# Patient Record
Sex: Female | Born: 1935 | Race: White | Hispanic: No | Smoking: Former smoker
Health system: Southern US, Community
[De-identification: ages and names within clinical notes are randomized; demographics above are authoritative.]

## PROBLEM LIST (undated history)

## (undated) DIAGNOSIS — R000 Tachycardia, unspecified: Secondary | ICD-10-CM

## (undated) DIAGNOSIS — R112 Nausea with vomiting, unspecified: Secondary | ICD-10-CM

## (undated) DIAGNOSIS — M1990 Unspecified osteoarthritis, unspecified site: Secondary | ICD-10-CM

## (undated) DIAGNOSIS — C801 Malignant (primary) neoplasm, unspecified: Secondary | ICD-10-CM

## (undated) DIAGNOSIS — I491 Atrial premature depolarization: Secondary | ICD-10-CM

## (undated) DIAGNOSIS — I809 Phlebitis and thrombophlebitis of unspecified site: Secondary | ICD-10-CM

## (undated) DIAGNOSIS — E785 Hyperlipidemia, unspecified: Secondary | ICD-10-CM

## (undated) DIAGNOSIS — S61209A Unspecified open wound of unspecified finger without damage to nail, initial encounter: Secondary | ICD-10-CM

## (undated) DIAGNOSIS — I10 Essential (primary) hypertension: Secondary | ICD-10-CM

## (undated) HISTORY — DX: Unspecified osteoarthritis, unspecified site: M19.90

## (undated) HISTORY — PX: MULTIPLE TOOTH EXTRACTIONS: SHX2053

## (undated) HISTORY — DX: Nausea with vomiting, unspecified: R11.2

## (undated) HISTORY — DX: Hyperlipidemia, unspecified: E78.5

## (undated) HISTORY — DX: Phlebitis and thrombophlebitis of unspecified site: I80.9

## (undated) HISTORY — DX: Tachycardia, unspecified: R00.0

## (undated) HISTORY — DX: Essential (primary) hypertension: I10

## (undated) HISTORY — DX: Atrial premature depolarization: I49.1

## (undated) HISTORY — PX: KIDNEY SURGERY: SHX687

## (undated) HISTORY — DX: Unspecified open wound of unspecified finger without damage to nail, initial encounter: S61.209A

---
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
## 1971-02-07 HISTORY — PX: ABDOMINAL HYSTERECTOMY: SHX81

## 1983-02-07 HISTORY — PX: STOMACH SURGERY: SHX791

## 1997-02-06 HISTORY — PX: COLON RESECTION: SHX5231

## 2009-02-06 HISTORY — PX: LUNG SURGERY: SHX703

## 2010-03-02 ENCOUNTER — Ambulatory Visit
Admission: RE | Admit: 2010-03-02 | Discharge: 2010-03-02 | Payer: Self-pay | Source: Home / Self Care | Admitting: Emergency Medicine

## 2010-03-02 ENCOUNTER — Emergency Department (HOSPITAL_BASED_OUTPATIENT_CLINIC_OR_DEPARTMENT_OTHER)
Admission: EM | Admit: 2010-03-02 | Discharge: 2010-03-02 | Payer: Self-pay | Source: Home / Self Care | Admitting: Emergency Medicine

## 2010-03-02 DIAGNOSIS — S61209A Unspecified open wound of unspecified finger without damage to nail, initial encounter: Secondary | ICD-10-CM | POA: Insufficient documentation

## 2010-03-02 DIAGNOSIS — E785 Hyperlipidemia, unspecified: Secondary | ICD-10-CM | POA: Insufficient documentation

## 2010-03-03 ENCOUNTER — Encounter: Payer: Self-pay | Admitting: Family Medicine

## 2010-03-03 ENCOUNTER — Telehealth (INDEPENDENT_AMBULATORY_CARE_PROVIDER_SITE_OTHER): Payer: Self-pay | Admitting: *Deleted

## 2010-03-10 NOTE — Assessment & Plan Note (Signed)
Summary: RT INDEX FINGER LAC./WSE   Vital Signs:  Patient Profile:   77 Years Old Female CC:      right index finger laceration O2 Sat:      98 % O2 treatment:    Room Air Pulse rate:   70 / minute Resp:     14 per minute BP sitting:   137 / 80  (left arm) Cuff size:   regular  Vitals Entered By: Lajean Saver RN (March 02, 2010 7:13 PM)                  Updated Prior Medication List: WARFARIN SODIUM 2.5 MG TABS (WARFARIN SODIUM) once daily * VITAMIN D  LOVASTATIN 10 MG TABS (LOVASTATIN)   Current Allergies: No known allergies History of Present Illness History from: patient & her daughter Chief Complaint: right index finger laceration History of Present Illness: She cut the tip of her R finger while opening a can today.  Doesn't recall her last Td.  She is taking Warfarin and she is still bleeding after 3 hours of putting pressure on it.  She called her PCP and told her to go to the ER.  Her last INR was  ~3.5.  Tenderness, no redness, no pus.  No F/C/N/V, dizziness, weakness.  REVIEW OF SYSTEMS Constitutional Symptoms      Denies fever, chills, night sweats, weight loss, weight gain, and fatigue.  Eyes       Denies change in vision, eye pain, eye discharge, glasses, contact lenses, and eye surgery. Ear/Nose/Throat/Mouth       Denies hearing loss/aids, change in hearing, ear pain, ear discharge, dizziness, frequent runny nose, frequent nose bleeds, sinus problems, sore throat, hoarseness, and tooth pain or bleeding.  Respiratory       Denies dry cough, productive cough, wheezing, shortness of breath, asthma, bronchitis, and emphysema/COPD.  Cardiovascular       Denies murmurs, chest pain, and tires easily with exhertion.    Gastrointestinal       Denies stomach pain, nausea/vomiting, diarrhea, constipation, blood in bowel movements, and indigestion. Genitourniary       Denies painful urination, kidney stones, and loss of urinary control. Neurological       Denies  paralysis, seizures, and fainting/blackouts. Musculoskeletal       Denies muscle pain, joint pain, joint stiffness, decreased range of motion, redness, swelling, muscle weakness, and gout.  Skin       Denies bruising, unusual mles/lumps or sores, and hair/skin or nail changes.      Comments: laceration to right index finger Psych       Denies mood changes, temper/anger issues, anxiety/stress, speech problems, depression, and sleep problems. Other Comments: patient cut finger while opening a can @ 2:45 today. She could not stop the bleeding. TDaP needed and given   Past History:  Past Medical History: blood clot stomach CA Colon CA Hyperlipidemia  Past Surgical History: Stomach CA 1985 Colon CA 2000  Social History: Never Smoked Alcohol use-no Drug use-no Smoking Status:  never Drug Use:  no Physical Exam General appearance: well developed, well nourished, no acute distress Head: normocephalic, atraumatic Heart: regular rate and  rhythm, no murmur MSE: oriented to time, place, and person R index finger: less than 1cm horizontal laceration at the tip, superficial.  It is oozing blood.  Mild tenderness.  No sign of infection.  Pressure stops bleeding only temporarily.  Finger with FROM DIP, PIP, and MCP.  Distal NV status intact.  No  other lacerations. Assessment New Problems: OPEN WOUND FINGER WITHOUT MENTION COMPLICATION (ICD-883.0) HYPERLIPIDEMIA (ICD-272.4)   Plan New Orders: Tdap => 25yrs IM [90715] Admin 1st Vaccine [90471] New Patient Level III [99203] Repair Superficial Wound(s) 2.5cm or <(scalp,neck,axillae,ext gent,trunk,extre) [12001] Planning Comments:   The wound stopped bleeding after dermabond and gelfoam application.  I didn't suture it because the stitches would have caused further bleeding.  I have advised her to follow up with her PCP as already scheduled.  She also needs to speak to him about her Warfarin dose.  I would continue the med as directed by  him and not stop it.    If the wound continues to bleed despite our treatment here, I believe the next step would be the ER since I don't have any further treatment options.  The wound should be kept clean and dry and should close up in a few days.    The patient and/or caregiver has been counseled thoroughly with regard to medications prescribed including dosage, schedule, interactions, rationale for use, and possible side effects and they verbalize understanding.  Diagnoses and expected course of recovery discussed and will return if not improved as expected or if the condition worsens. Patient and/or caregiver verbalized understanding.   PROCEDURE:  Dermabond Site: R index finger Size: 3/4 cm Number of Lacerations: 1 Anesthesia: none Procedure: Cleaned finger in Hibaclens and saline.  Explored wound, no foreign bodies seen, very superficial.  Dermabond placed over wound, then used Gelfoam.  This stopped the bleeding.  Then was covered with gauze and wrapped with Coban.  Wound precautions explained to patient and daughter.  Orders Added: 1)  Tdap => 58yrs IM [90715] 2)  Admin 1st Vaccine [90471] 3)  New Patient Level III [33295] 4)  Repair Superficial Wound(s) 2.5cm or <(scalp,neck,axillae,ext gent,trunk,extre) [12001]   Immunizations Administered:  Tetanus Vaccine:    Vaccine Type: Tdap    Site: right deltoid    Mfr: GlaxoSmithKline    Dose: 0.5 ml    Route: IM    Given by: Lajean Saver RN    Exp. Date: 11/26/2011    Lot #: [id number]    VIS given: 12/25/07 version given March 02, 2010.   Immunizations Administered:  Tetanus Vaccine:    Vaccine Type: Tdap    Site: right deltoid    Mfr: GlaxoSmithKline    Dose: 0.5 ml    Route: IM    Given by: Lajean Saver RN    Exp. Date: 11/26/2011    Lot #: [id number]    VIS given: 12/25/07 version given March 02, 2010.

## 2010-03-10 NOTE — Miscellaneous (Signed)
Summary: TDaP VACCINE AUTH. FORM  TDaP VACCINE AUTH. FORM   Imported By: Tawni Carnes 03/03/2010 10:23:42  _____________________________________________________________________  External Attachment:    Type:   Image     Comment:   External Document

## 2010-03-10 NOTE — Progress Notes (Signed)
  Phone Note Outgoing Call   Call placed by: Clemens Catholic LPN,  March 03, 2010 8:52 AM Call placed to: Patient Summary of Call: call back: pt states that she has not had anymore bleeding from her finger laceration. reminded her if any problems to F/U with PCP or ED. pt agrees. Initial call taken by: Clemens Catholic LPN,  March 03, 2010 8:54 AM

## 2010-06-10 ENCOUNTER — Emergency Department (HOSPITAL_BASED_OUTPATIENT_CLINIC_OR_DEPARTMENT_OTHER)
Admission: EM | Admit: 2010-06-10 | Discharge: 2010-06-10 | Disposition: A | Discharge status: Home / Self Care | Payer: MEDICARE | Attending: Emergency Medicine | Admitting: Emergency Medicine

## 2010-06-10 ENCOUNTER — Emergency Department (INDEPENDENT_AMBULATORY_CARE_PROVIDER_SITE_OTHER): Payer: MEDICARE

## 2010-06-10 DIAGNOSIS — Y93E1 Activity, personal bathing and showering: Secondary | ICD-10-CM

## 2010-06-10 DIAGNOSIS — S2239XA Fracture of one rib, unspecified side, initial encounter for closed fracture: Secondary | ICD-10-CM

## 2010-06-10 DIAGNOSIS — W010XXA Fall on same level from slipping, tripping and stumbling without subsequent striking against object, initial encounter: Secondary | ICD-10-CM

## 2010-06-10 DIAGNOSIS — Z86718 Personal history of other venous thrombosis and embolism: Secondary | ICD-10-CM | POA: Insufficient documentation

## 2010-06-10 DIAGNOSIS — W1809XA Striking against other object with subsequent fall, initial encounter: Secondary | ICD-10-CM | POA: Insufficient documentation

## 2010-06-10 DIAGNOSIS — Y92009 Unspecified place in unspecified non-institutional (private) residence as the place of occurrence of the external cause: Secondary | ICD-10-CM | POA: Insufficient documentation

## 2010-06-10 LAB — BASIC METABOLIC PANEL
Calcium: 10.4 mg/dL (ref 8.4–10.5)
Creatinine, Ser: 1 mg/dL (ref 0.4–1.2)
GFR calc Af Amer: >60 mL/min (ref >60)
GFR calc non Af Amer: 53 mL/min — ABNORMAL LOW (ref >60)

## 2010-06-10 LAB — URINALYSIS, ROUTINE W REFLEX MICROSCOPIC
Bilirubin Urine: NEGATIVE
Nitrite: NEGATIVE
Specific Gravity, Urine: 1.017 (ref 1.005–1.030)
pH: 5 (ref 5.0–8.0)

## 2010-06-10 LAB — CBC
Hemoglobin: 14.2 g/dL (ref 12.0–15.0)
MCV: 86 fL (ref 78.0–100.0)
Platelets: 160 10*3/uL (ref 150–400)
RBC: 4.79 MIL/uL (ref 3.87–5.11)
WBC: 8.5 10*3/uL (ref 4.0–10.5)

## 2010-06-10 LAB — DIFFERENTIAL
Eosinophils Absolute: 0.1 10*3/uL (ref 0.0–0.7)
Lymphs Abs: 1.5 10*3/uL (ref 0.7–4.0)
Neutro Abs: 6.1 10*3/uL (ref 1.7–7.7)
Neutrophils Relative %: 72 % (ref 43–77)

## 2010-06-10 LAB — PROTIME-INR: INR: 1.84 — ABNORMAL HIGH (ref 0.00–1.49)

## 2010-06-21 ENCOUNTER — Emergency Department (INDEPENDENT_AMBULATORY_CARE_PROVIDER_SITE_OTHER): Payer: Medicare Other

## 2010-06-21 ENCOUNTER — Emergency Department (HOSPITAL_BASED_OUTPATIENT_CLINIC_OR_DEPARTMENT_OTHER)
Admission: EM | Admit: 2010-06-21 | Discharge: 2010-06-22 | Disposition: A | Discharge status: Other Acute Inpatient Hospital | Payer: Medicare Other | Source: Home / Self Care | Attending: Emergency Medicine | Admitting: Emergency Medicine

## 2010-06-21 DIAGNOSIS — Y92009 Unspecified place in unspecified non-institutional (private) residence as the place of occurrence of the external cause: Secondary | ICD-10-CM | POA: Insufficient documentation

## 2010-06-21 DIAGNOSIS — W19XXXA Unspecified fall, initial encounter: Secondary | ICD-10-CM | POA: Insufficient documentation

## 2010-06-21 DIAGNOSIS — J90 Pleural effusion, not elsewhere classified: Secondary | ICD-10-CM

## 2010-06-21 DIAGNOSIS — Z86718 Personal history of other venous thrombosis and embolism: Secondary | ICD-10-CM | POA: Insufficient documentation

## 2010-06-21 DIAGNOSIS — R0602 Shortness of breath: Secondary | ICD-10-CM | POA: Insufficient documentation

## 2010-06-21 DIAGNOSIS — R109 Unspecified abdominal pain: Secondary | ICD-10-CM

## 2010-06-21 LAB — CBC
Hemoglobin: 12.7 g/dL (ref 12.0–15.0)
MCHC: 34.3 g/dL (ref 30.0–36.0)
RBC: 4.24 MIL/uL (ref 3.87–5.11)

## 2010-06-21 LAB — DIFFERENTIAL
Basophils Absolute: 0 10*3/uL (ref 0.0–0.1)
Basophils Relative: 1 % (ref 0–1)
Neutro Abs: 3.7 10*3/uL (ref 1.7–7.7)
Neutrophils Relative %: 60 % (ref 43–77)

## 2010-06-21 LAB — PROTIME-INR
INR: 2.25 — ABNORMAL HIGH (ref 0.00–1.49)
Prothrombin Time: 25 seconds — ABNORMAL HIGH (ref 11.6–15.2)

## 2010-06-21 MED ORDER — IOHEXOL 300 MG/ML  SOLN: 100.0000 mL | Freq: Once | INTRAMUSCULAR | Status: AC | PRN

## 2010-06-22 ENCOUNTER — Emergency Department (INDEPENDENT_AMBULATORY_CARE_PROVIDER_SITE_OTHER): Payer: Medicare Other

## 2010-06-22 ENCOUNTER — Inpatient Hospital Stay (HOSPITAL_COMMUNITY): Payer: Medicare Other

## 2010-06-22 ENCOUNTER — Inpatient Hospital Stay (HOSPITAL_COMMUNITY)
Admission: EM | Admit: 2010-06-22 | Discharge: 2010-06-27 | DRG: 183 | Disposition: A | Discharge status: Home Health | Payer: Medicare Other | Attending: Surgery | Admitting: Surgery

## 2010-06-22 DIAGNOSIS — Y92009 Unspecified place in unspecified non-institutional (private) residence as the place of occurrence of the external cause: Secondary | ICD-10-CM

## 2010-06-22 DIAGNOSIS — Z85038 Personal history of other malignant neoplasm of large intestine: Secondary | ICD-10-CM

## 2010-06-22 DIAGNOSIS — Z85028 Personal history of other malignant neoplasm of stomach: Secondary | ICD-10-CM

## 2010-06-22 DIAGNOSIS — R51 Headache: Secondary | ICD-10-CM

## 2010-06-22 DIAGNOSIS — S2249XA Multiple fractures of ribs, unspecified side, initial encounter for closed fracture: Secondary | ICD-10-CM

## 2010-06-22 DIAGNOSIS — E785 Hyperlipidemia, unspecified: Secondary | ICD-10-CM | POA: Diagnosis present

## 2010-06-22 DIAGNOSIS — Z043 Encounter for examination and observation following other accident: Secondary | ICD-10-CM

## 2010-06-22 DIAGNOSIS — D62 Acute posthemorrhagic anemia: Secondary | ICD-10-CM | POA: Diagnosis present

## 2010-06-22 DIAGNOSIS — W19XXXA Unspecified fall, initial encounter: Secondary | ICD-10-CM

## 2010-06-22 DIAGNOSIS — K5900 Constipation, unspecified: Secondary | ICD-10-CM | POA: Diagnosis present

## 2010-06-22 DIAGNOSIS — R109 Unspecified abdominal pain: Secondary | ICD-10-CM

## 2010-06-22 DIAGNOSIS — S271XXA Traumatic hemothorax, initial encounter: Secondary | ICD-10-CM | POA: Diagnosis present

## 2010-06-22 DIAGNOSIS — W1809XA Striking against other object with subsequent fall, initial encounter: Secondary | ICD-10-CM | POA: Diagnosis present

## 2010-06-22 DIAGNOSIS — Z86718 Personal history of other venous thrombosis and embolism: Secondary | ICD-10-CM

## 2010-06-22 LAB — CBC
MCH: 29.6 pg (ref 26.0–34.0)
MCHC: 33.9 g/dL (ref 30.0–36.0)
Platelets: 209 10*3/uL (ref 150–400)
RBC: 3.98 MIL/uL (ref 3.87–5.11)
RDW: 15.8 % — ABNORMAL HIGH (ref 11.5–15.5)

## 2010-06-22 LAB — BASIC METABOLIC PANEL
BUN: 15 mg/dL (ref 6–23)
Calcium: 10.2 mg/dL (ref 8.4–10.5)
Creatinine, Ser: 0.98 mg/dL (ref 0.4–1.2)
GFR calc Af Amer: >60 mL/min (ref >60)

## 2010-06-22 LAB — URINALYSIS, ROUTINE W REFLEX MICROSCOPIC
Glucose, UA: NEGATIVE mg/dL
Hgb urine dipstick: NEGATIVE
Protein, ur: NEGATIVE mg/dL
pH: 5.5 (ref 5.0–8.0)

## 2010-06-22 LAB — MRSA PCR SCREENING: MRSA by PCR: NEGATIVE

## 2010-06-22 LAB — COMPREHENSIVE METABOLIC PANEL
Albumin: 4 g/dL (ref 3.5–5.2)
Alkaline Phosphatase: 94 U/L (ref 39–117)
BUN: 18 mg/dL (ref 6–23)
Potassium: 4 mEq/L (ref 3.5–5.1)
Sodium: 139 mEq/L (ref 135–145)
Total Protein: 7.9 g/dL (ref 6.0–8.3)

## 2010-06-22 LAB — URINE MICROSCOPIC-ADD ON

## 2010-06-22 LAB — ABO/RH: ABO/RH(D): A NEG

## 2010-06-22 LAB — PROTIME-INR: Prothrombin Time: 28.2 seconds — ABNORMAL HIGH (ref 11.6–15.2)

## 2010-06-22 MED ORDER — IOHEXOL 300 MG/ML  SOLN
100.0000 mL | Freq: Once | INTRAMUSCULAR | Status: AC | PRN
  Administered 2010-06-22: 100 mL via INTRAVENOUS

## 2010-06-23 ENCOUNTER — Inpatient Hospital Stay (HOSPITAL_COMMUNITY): Payer: Medicare Other

## 2010-06-23 LAB — CBC
HCT: 41.6 % (ref 36.0–46.0)
MCH: 30.2 pg (ref 26.0–34.0)
MCHC: 33.9 g/dL (ref 30.0–36.0)
MCV: 89.1 fL (ref 78.0–100.0)
Platelets: 233 10*3/uL (ref 150–400)
RDW: 15.9 % — ABNORMAL HIGH (ref 11.5–15.5)
WBC: 6.3 10*3/uL (ref 4.0–10.5)

## 2010-06-24 DIAGNOSIS — M79609 Pain in unspecified limb: Secondary | ICD-10-CM

## 2010-06-24 LAB — CROSSMATCH
ABO/RH(D): A NEG
Unit division: 0

## 2010-06-24 LAB — CBC
HCT: 37.6 % (ref 36.0–46.0)
MCH: 29.6 pg (ref 26.0–34.0)
MCV: 88.5 fL (ref 78.0–100.0)
Platelets: 210 10*3/uL (ref 150–400)
RDW: 15.7 % — ABNORMAL HIGH (ref 11.5–15.5)

## 2010-06-25 LAB — CBC
HCT: 36 % (ref 36.0–46.0)
MCH: 30 pg (ref 26.0–34.0)
MCV: 89.3 fL (ref 78.0–100.0)
Platelets: 217 10*3/uL (ref 150–400)
RDW: 15.7 % — ABNORMAL HIGH (ref 11.5–15.5)
WBC: 5.5 10*3/uL (ref 4.0–10.5)

## 2010-06-26 ENCOUNTER — Inpatient Hospital Stay (HOSPITAL_COMMUNITY): Payer: Medicare Other

## 2010-06-27 ENCOUNTER — Inpatient Hospital Stay (HOSPITAL_COMMUNITY): Payer: Medicare Other

## 2010-06-27 LAB — CBC
HCT: 37.5 % (ref 36.0–46.0)
Hemoglobin: 12.5 g/dL (ref 12.0–15.0)
MCH: 29.8 pg (ref 26.0–34.0)
MCHC: 33.3 g/dL (ref 30.0–36.0)
RDW: 15.5 % (ref 11.5–15.5)

## 2010-07-07 NOTE — Discharge Summary (Signed)
  NAME:  [name] [name]                 ACCOUNT NO.:  [id number]  MEDICAL RECORD NO.:  [id number]           PATIENT TYPE:  I  LOCATION:  5020                         FACILITY:  [facility]  PHYSICIAN:  [name] [name], M.D. DATE OF BIRTH:  28-Apr-1924  DATE OF ADMISSION:  06/22/2010 DATE OF DISCHARGE:  06/27/2010                              DISCHARGE SUMMARY   DISCHARGE DIAGNOSES: 1. Fall. 2. Left rib fractures, 8-10 with hemothorax. 3. History of deep vein thrombosis, on Coumadin. 4. History of colon cancer. 5. History of gastric cancer. 6. Hyperlipidemia.  CONSULTANTS:  D. Oley Balm, MD, for Interventional Radiology.  PROCEDURES:  Left thoracentesis by Dr. Deanne [name].  HISTORY OF PRESENT ILLNESS:  This is an 77 year old white female who presented to the emergency room at [facility] with left chest wall pain and increasing shortness of breath.  She gave a history of falling in her bathroom 11 days prior.  She was evaluated in the ED at that point and was diagnosed with a single rib fracture and discharged home.  Further workup at [facility] demonstrated 3 rib fractures and a moderately-sized effusion versus hemothorax.  She was transferred to [facility] for admission by the Trauma Service.  HOSPITAL COURSE:  The patient's Coumadin was stopped and her INR was allowed to drift down to a normal range with the help of vitamin K.  Her DVT was reportedly years ago and so there is some question about whether she needs to be on Coumadin anyway.  Since she was still having some dyspnea, we obtained a thoracentesis.  This did seem to make some symptomatic improvement in the patient.  She was mobilized with physical and occupational therapy and did well.  She did show some small reaccumulation of fluid on that left side, but it seemed stable with a followup chest x-ray and so she was able to be discharged home in good condition in the care of her daughter.  DISCHARGE  MEDICATIONS:  The patient had no new medications upon discharge from the hospital.  She may resume her hydrocodone and Tylenol 5/500 combination 1 tablet every 4 hours as needed for pain, iron 325 mg daily, lorazepam 0.5 mg daily at bedtime, lovastatin 40 mg daily, and vitamin D2 over-the-counter daily.  She should stop the Coumadin and the Percocet.  FOLLOWUP:  She may call the Trauma Service with questions, but she should really follow up with Primary Care sometime in the next 4 weeks. She tells me that she is going to be changing primary care providers and so not to send the records anywhere as of yet.     [name] [name], P.A.   ______________________________ [name] [name], M.D.    MJ/MEDQ  D:  06/27/2010  T:  06/27/2010  Job:  [id number]  Electronically Signed by [name] [name] P.A. on 07/01/2010 02:25:20 PM Electronically Signed by [name] [name] M.D. on 07/07/2010 09:35:42 AM

## 2010-07-19 ENCOUNTER — Emergency Department (INDEPENDENT_AMBULATORY_CARE_PROVIDER_SITE_OTHER): Payer: Medicare Other

## 2010-07-19 ENCOUNTER — Emergency Department (HOSPITAL_BASED_OUTPATIENT_CLINIC_OR_DEPARTMENT_OTHER)
Admission: EM | Admit: 2010-07-19 | Discharge: 2010-07-19 | Disposition: A | Discharge status: Home / Self Care | Payer: Medicare Other | Attending: Emergency Medicine | Admitting: Emergency Medicine

## 2010-07-19 DIAGNOSIS — Z86718 Personal history of other venous thrombosis and embolism: Secondary | ICD-10-CM | POA: Insufficient documentation

## 2010-07-19 DIAGNOSIS — W19XXXA Unspecified fall, initial encounter: Secondary | ICD-10-CM

## 2010-07-19 DIAGNOSIS — J90 Pleural effusion, not elsewhere classified: Secondary | ICD-10-CM | POA: Insufficient documentation

## 2010-07-19 DIAGNOSIS — Z79899 Other long term (current) drug therapy: Secondary | ICD-10-CM | POA: Insufficient documentation

## 2010-07-19 DIAGNOSIS — M79609 Pain in unspecified limb: Secondary | ICD-10-CM | POA: Insufficient documentation

## 2010-07-19 DIAGNOSIS — S2239XA Fracture of one rib, unspecified side, initial encounter for closed fracture: Secondary | ICD-10-CM

## 2010-07-19 DIAGNOSIS — S2249XA Multiple fractures of ribs, unspecified side, initial encounter for closed fracture: Secondary | ICD-10-CM | POA: Insufficient documentation

## 2011-09-01 ENCOUNTER — Emergency Department (HOSPITAL_BASED_OUTPATIENT_CLINIC_OR_DEPARTMENT_OTHER): Payer: Medicare Other

## 2011-09-01 ENCOUNTER — Emergency Department (HOSPITAL_BASED_OUTPATIENT_CLINIC_OR_DEPARTMENT_OTHER)
Admission: EM | Admit: 2011-09-01 | Discharge: 2011-09-02 | Disposition: A | Discharge status: Home / Self Care | Payer: Medicare Other | Attending: Emergency Medicine | Admitting: Emergency Medicine

## 2011-09-01 ENCOUNTER — Encounter (HOSPITAL_BASED_OUTPATIENT_CLINIC_OR_DEPARTMENT_OTHER): Payer: Self-pay | Admitting: *Deleted

## 2011-09-01 DIAGNOSIS — S0990XA Unspecified injury of head, initial encounter: Secondary | ICD-10-CM

## 2011-09-01 DIAGNOSIS — W1789XA Other fall from one level to another, initial encounter: Secondary | ICD-10-CM | POA: Insufficient documentation

## 2011-09-01 DIAGNOSIS — IMO0002 Reserved for concepts with insufficient information to code with codable children: Secondary | ICD-10-CM

## 2011-09-01 HISTORY — DX: Malignant (primary) neoplasm, unspecified: C80.1

## 2011-09-01 MED ORDER — TRAMADOL HCL 50 MG PO TABS
50.0000 mg | ORAL_TABLET | Freq: Once | ORAL | Status: AC
  Administered 2011-09-02: 50 mg via ORAL
  Filled 2011-09-01: qty 1

## 2011-09-01 NOTE — ED Notes (Signed)
Pt was walking and her dog ran up behind her and buckled her knees causing her to fall. Pt has hematoma to right side of head, denies LOC. Pt also has skin tear to right elbow. Pt is ambulatory at this time.

## 2011-09-01 NOTE — ED Notes (Signed)
Patient transported to X-ray 

## 2011-09-01 NOTE — ED Provider Notes (Signed)
History     CSN: [id number]  Arrival date & time 09/01/11  2243   First MD Initiated Contact with Patient 09/01/11 2316      Chief Complaint  Patient presents with  . Fall  . Head Injury    (Consider location/radiation/quality/duration/timing/severity/associated sxs/prior treatment) Patient is a 78 y.o. female presenting with fall and head injury. The history is provided by the patient. No language interpreter was used.  Fall The accident occurred 1 to 2 hours ago. The fall occurred while walking. She fell from a height of 1 to 2 ft. She landed on a hard floor. The volume of blood lost was minimal. The point of impact was the head (right elbow). The pain is present in the head. The pain is at a severity of 8/10. The pain is severe. She was ambulatory at the scene. There was no entrapment after the fall. There was no drug use involved in the accident. There was no alcohol use involved in the accident. Pertinent negatives include no visual change, no fever, no numbness and no abdominal pain. The symptoms are aggravated by activity. Prehospitalization: none. She has tried nothing for the symptoms. The treatment provided no relief.  Head Injury  Pertinent negatives include no numbness.    Past Medical History  Diagnosis Date  . Cancer     Past Surgical History  Procedure Date  . Abdominal hysterectomy   . Colon resection     History reviewed. No pertinent family history.  History  Substance Use Topics  . Smoking status: Never Smoker   . Smokeless tobacco: Not on file  . Alcohol Use: No    OB History    Grav Para Term Preterm Abortions TAB SAB Ect Mult Living                  Review of Systems  Constitutional: Negative for fever.  Gastrointestinal: Negative for abdominal pain.  Skin: Positive for wound.  Neurological: Negative for numbness.  All other systems reviewed and are negative.    Allergies  Review of patient's allergies indicates no known  allergies.  Home Medications   Current Outpatient Rx  Name Route Sig Dispense Refill  . LOVASTATIN 10 MG PO TABS Oral Take 10 mg by mouth at bedtime.    Marland Kitchen ZOLPIDEM TARTRATE 10 MG PO TABS Oral Take 10 mg by mouth at bedtime as needed. For sleep      BP 93/67  Pulse 59  Temp 97.4 F (36.3 C) (Oral)  Resp 16  Ht 5\' 2"  (1.575 m)  Wt 115 lb (52.164 kg)  BMI 21.03 kg/m2  SpO2 97%  Physical Exam  Constitutional: She is oriented to person, place, and time. She appears well-developed and well-nourished. No distress.  HENT:  Head: Normocephalic.    Right Ear: No hemotympanum.  Left Ear: No hemotympanum.  Mouth/Throat: Oropharynx is clear and moist.  Eyes: Conjunctivae and EOM are normal. Pupils are equal, round, and reactive to light.  Neck: Normal range of motion. Neck supple. No tracheal deviation present.  Cardiovascular: Normal rate and regular rhythm.   Pulmonary/Chest: Effort normal and breath sounds normal. She exhibits no tenderness.  Abdominal: Soft. Bowel sounds are normal. There is no tenderness. There is no rebound and no guarding.  Musculoskeletal: Normal range of motion. She exhibits no tenderness.       No snuff box tenderness of either wrist.  Negative anterior and posterior drawer tests B knees  Neurological: She is alert and oriented to  person, place, and time. She has normal reflexes.  Skin: Skin is warm and dry.     Psychiatric: She has a normal mood and affect.    ED Course  Procedures (including critical care time)  Labs Reviewed - No data to display No results found.   No diagnosis found.    MDM  Follow up with your family doctor for ongoing care        Jameson Morrow K Faith Patricelli-Rasch, MD 09/02/11 1610

## 2011-09-02 MED ORDER — TRAMADOL HCL 50 MG PO TABS: 50.0000 mg | ORAL_TABLET | Freq: Four times a day (QID) | ORAL | Status: AC | PRN

## 2012-06-16 ENCOUNTER — Observation Stay (HOSPITAL_COMMUNITY)
Admission: EM | Admit: 2012-06-16 | Discharge: 2012-06-17 | DRG: 310 | Disposition: A | Discharge status: Home / Self Care | Payer: Medicare Other | Attending: Cardiology | Admitting: Cardiology

## 2012-06-16 ENCOUNTER — Emergency Department (HOSPITAL_COMMUNITY): Payer: Medicare Other

## 2012-06-16 DIAGNOSIS — Z79899 Other long term (current) drug therapy: Secondary | ICD-10-CM | POA: Insufficient documentation

## 2012-06-16 DIAGNOSIS — Z85038 Personal history of other malignant neoplasm of large intestine: Secondary | ICD-10-CM | POA: Insufficient documentation

## 2012-06-16 DIAGNOSIS — E785 Hyperlipidemia, unspecified: Secondary | ICD-10-CM | POA: Insufficient documentation

## 2012-06-16 DIAGNOSIS — I4891 Unspecified atrial fibrillation: Secondary | ICD-10-CM

## 2012-06-16 DIAGNOSIS — I491 Atrial premature depolarization: Secondary | ICD-10-CM

## 2012-06-16 DIAGNOSIS — R197 Diarrhea, unspecified: Secondary | ICD-10-CM

## 2012-06-16 DIAGNOSIS — I498 Other specified cardiac arrhythmias: Principal | ICD-10-CM | POA: Insufficient documentation

## 2012-06-16 DIAGNOSIS — R000 Tachycardia, unspecified: Secondary | ICD-10-CM

## 2012-06-16 DIAGNOSIS — R112 Nausea with vomiting, unspecified: Secondary | ICD-10-CM

## 2012-06-16 LAB — COMPREHENSIVE METABOLIC PANEL
ALT: 8 U/L (ref 0–35)
AST: 21 U/L (ref 0–37)
Albumin: 4.6 g/dL (ref 3.5–5.2)
Alkaline Phosphatase: 54 U/L (ref 39–117)
Calcium: 9.4 mg/dL (ref 8.4–10.5)
Potassium: 4.9 mEq/L (ref 3.5–5.1)
Sodium: 139 mEq/L (ref 135–145)
Total Protein: 8.5 g/dL — ABNORMAL HIGH (ref 6.0–8.3)

## 2012-06-16 LAB — CBC WITH DIFFERENTIAL/PLATELET
Basophils Absolute: 0 10*3/uL (ref 0.0–0.1)
Eosinophils Absolute: 0 10*3/uL (ref 0.0–0.7)
Eosinophils Relative: 0 % (ref 0–5)
MCH: 29 pg (ref 26.0–34.0)
MCV: 86.7 fL (ref 78.0–100.0)
Neutrophils Relative %: 94 % — ABNORMAL HIGH (ref 43–77)
Platelets: 207 10*3/uL (ref 150–400)
RBC: 5.11 MIL/uL (ref 3.87–5.11)
RDW: 15.1 % (ref 11.5–15.5)
WBC: 11.5 10*3/uL — ABNORMAL HIGH (ref 4.0–10.5)

## 2012-06-16 LAB — TROPONIN I: Troponin I: <0.3 ng/mL (ref <0.30)

## 2012-06-16 LAB — LIPASE, BLOOD: Lipase: 49 U/L (ref 11–59)

## 2012-06-16 MED ORDER — SODIUM CHLORIDE 0.9 % IV BOLUS (SEPSIS)
500.0000 mL | Freq: Once | INTRAVENOUS | Status: AC
  Administered 2012-06-16: 1000 mL via INTRAVENOUS

## 2012-06-16 MED ORDER — DEXTROSE 5 % IV SOLN
5.0000 mg/h | INTRAVENOUS | Status: DC
  Administered 2012-06-16: 5 mg/h via INTRAVENOUS

## 2012-06-16 MED ORDER — DEXTROSE 5 % IV SOLN
5.0000 mg/h | INTRAVENOUS | Status: DC
  Administered 2012-06-17: 5 mg/h via INTRAVENOUS

## 2012-06-16 MED ORDER — ASPIRIN EC 81 MG PO TBEC
81.0000 mg | DELAYED_RELEASE_TABLET | Freq: Every day | ORAL | Status: DC
  Administered 2012-06-17: 81 mg via ORAL
  Filled 2012-06-16: qty 1

## 2012-06-16 MED ORDER — SODIUM CHLORIDE 0.9 % IV SOLN
25.0000 ug/h | INTRAVENOUS | Status: DC
  Filled 2012-06-16: qty 50

## 2012-06-16 MED ORDER — DILTIAZEM HCL ER COATED BEADS 120 MG PO CP24
120.0000 mg | ORAL_CAPSULE | Freq: Every day | ORAL | Status: DC
  Administered 2012-06-17: 120 mg via ORAL
  Filled 2012-06-16: qty 1

## 2012-06-16 MED ORDER — SIMVASTATIN 20 MG PO TABS
20.0000 mg | ORAL_TABLET | Freq: Every day | ORAL | Status: DC
  Filled 2012-06-16: qty 1

## 2012-06-16 MED ORDER — SODIUM CHLORIDE 0.9 % IV SOLN
INTRAVENOUS | Status: DC
  Administered 2012-06-16 – 2012-06-17 (×2): via INTRAVENOUS

## 2012-06-16 MED ORDER — HEPARIN BOLUS VIA INFUSION
2500.0000 [IU] | Freq: Once | INTRAVENOUS | Status: AC
  Administered 2012-06-16: 2500 [IU] via INTRAVENOUS

## 2012-06-16 MED ORDER — ONDANSETRON HCL 4 MG/2ML IJ SOLN
4.0000 mg | Freq: Four times a day (QID) | INTRAMUSCULAR | Status: DC | PRN
  Administered 2012-06-17: 4 mg via INTRAVENOUS
  Filled 2012-06-16: qty 2

## 2012-06-16 MED ORDER — NITROGLYCERIN 0.4 MG SL SUBL: 0.4000 mg | SUBLINGUAL_TABLET | SUBLINGUAL | Status: DC | PRN

## 2012-06-16 MED ORDER — DILTIAZEM HCL 25 MG/5ML IV SOLN: 10.0000 mg | Freq: Once | INTRAVENOUS | Status: DC

## 2012-06-16 MED ORDER — ACETAMINOPHEN 325 MG PO TABS
650.0000 mg | ORAL_TABLET | ORAL | Status: DC | PRN
  Administered 2012-06-17: 650 mg via ORAL
  Filled 2012-06-16: qty 2

## 2012-06-16 MED ORDER — SODIUM CHLORIDE 0.9 % IV SOLN
1.0000 mg/h | INTRAVENOUS | Status: DC
  Filled 2012-06-16: qty 10

## 2012-06-16 MED ORDER — LORATADINE 10 MG PO TABS
10.0000 mg | ORAL_TABLET | Freq: Every day | ORAL | Status: DC
  Administered 2012-06-17: 10 mg via ORAL
  Filled 2012-06-16: qty 1

## 2012-06-16 MED ORDER — DILTIAZEM HCL 25 MG/5ML IV SOLN
20.0000 mg | Freq: Once | INTRAVENOUS | Status: AC
  Administered 2012-06-16: 20 mg via INTRAVENOUS

## 2012-06-16 MED ORDER — HEPARIN (PORCINE) IN NACL 100-0.45 UNIT/ML-% IJ SOLN
600.0000 [IU]/h | INTRAMUSCULAR | Status: DC
  Administered 2012-06-17: 600 [IU]/h via INTRAVENOUS
  Filled 2012-06-16: qty 250

## 2012-06-16 NOTE — ED Provider Notes (Signed)
History     CSN: [id number]  Arrival date & time 06/16/12  2054   First MD Initiated Contact with Patient 06/16/12 2057      Chief Complaint  Patient presents with  . Code STEMI    (Consider location/radiation/quality/duration/timing/severity/associated sxs/prior treatment) HPI Pt with vomiting and diarrhea starting earlier today. No chest pain at any point. No abd pain. EMS called and found pt tachycardic. EKG with questionable interior elevation. STEMI called. Pt denies prior cardiac history. States for the past few days she has had episodic palpitations. No SOB, cough, urinary symptoms.  Past Medical History  Diagnosis Date  . Cancer     Past Surgical History  Procedure Laterality Date  . Abdominal hysterectomy    . Colon resection      No family history on file.  History  Substance Use Topics  . Smoking status: Never Smoker   . Smokeless tobacco: Not on file  . Alcohol Use: No    OB History   Grav Para Term Preterm Abortions TAB SAB Ect Mult Living                  Review of Systems  Constitutional: Positive for fatigue. Negative for fever and chills.  Respiratory: Negative for shortness of breath.   Cardiovascular: Positive for palpitations. Negative for chest pain.  Gastrointestinal: Positive for nausea, vomiting and diarrhea. Negative for abdominal pain.  Genitourinary: Negative for dysuria and frequency.  Musculoskeletal: Negative for back pain.  Skin: Negative for rash and wound.  Neurological: Negative for dizziness, weakness, light-headedness, numbness and headaches.  All other systems reviewed and are negative.    Allergies  Review of patient's allergies indicates no known allergies.  Home Medications   Current Outpatient Rx  Name  Route  Sig  Dispense  Refill  . aspirin-sod bicarb-citric acid (ALKA-SELTZER) 325 MG TBEF   Oral   Take 325 mg by mouth daily as needed. For heartburn/indigestion         . cetirizine (ZYRTEC) 10 MG tablet    Oral   Take 10 mg by mouth daily.         . fluticasone (FLONASE) 50 MCG/ACT nasal spray   Nasal   Place 2 sprays into the nose daily as needed. For allergy symptoms         . lovastatin (MEVACOR) 40 MG tablet   Oral   Take 40 mg by mouth at bedtime.           There were no vitals taken for this visit.  Physical Exam  Nursing note and vitals reviewed. Constitutional: She is oriented to person, place, and time. She appears well-developed and well-nourished. No distress.  HENT:  Head: Normocephalic and atraumatic.  Mouth/Throat: Oropharynx is clear and moist.  Eyes: EOM are normal. Pupils are equal, round, and reactive to light.  Neck: Normal range of motion. Neck supple.  Cardiovascular:  Tachy irreg irreg  Pulmonary/Chest: Effort normal and breath sounds normal. No respiratory distress. She has no wheezes. She has no rales.  Abdominal: Soft. Bowel sounds are normal. She exhibits no distension and no mass. There is no tenderness. There is no rebound and no guarding.  Musculoskeletal: Normal range of motion. She exhibits no edema and no tenderness.  No calf swelling or pain  Neurological: She is alert and oriented to person, place, and time.  5/5 motor in all ext, sensation intact  Skin: Skin is warm and dry. No rash noted. No erythema.  Psychiatric: She  has a normal mood and affect. Her behavior is normal.    ED Course  Procedures (including critical care time)  Labs Reviewed  CBC WITH DIFFERENTIAL - Abnormal; Notable for the following:    WBC 11.5 (*)    Neutrophils Relative 94 (*)    Neutro Abs 10.7 (*)    Lymphocytes Relative 3 (*)    Lymphs Abs 0.3 (*)    All other components within normal limits  COMPREHENSIVE METABOLIC PANEL - Abnormal; Notable for the following:    CO2 18 (*)    Glucose, Bld 163 (*)    Total Protein 8.5 (*)    GFR calc non Af Amer 47 (*)    GFR calc Af Amer 55 (*)    All other components within normal limits  TROPONIN I  PROTIME-INR   LIPASE, BLOOD  URINALYSIS, ROUTINE W REFLEX MICROSCOPIC   Dg Chest Port 1 View  06/16/2012  *RADIOLOGY REPORT*  Clinical Data: Tachycardia.  PORTABLE CHEST - 1 VIEW  Comparison: 07/19/2010  Findings: Mild hyperinflation of the lungs, stable.  Heart is normal size.  No confluent opacities or effusions.  No acute bony abnormality.  IMPRESSION: No acute findings.   Original Report Authenticated By: Charlett Nose, M.D.      1. Atrial fibrillation with RVR   2. Vomiting and diarrhea     Date: 06/16/2012  Rate: 143  Rhythm: atrial fibrillation  QRS Axis: normal  Intervals: normal  ST/T Wave abnormalities: normal  Conduction Disutrbances:none  Narrative Interpretation:   Old EKG Reviewed: none available Wandering baseline. No STEMI    MDM  Cardiology at bedside and will admit for new onset afib with RVR. STEMI cancelled.         Loren Racer, MD 06/16/12 2210

## 2012-06-16 NOTE — H&P (Signed)
Amber [name] is an 79 y.o. female.    PCP: Verdene Rio  Chief Complaint: Nausea,vomiting, diarrhea and palpitation  HPI: 79 y/o female with a PMH of HTN presenting to [facility] ED for evaluation of nausea, vomiting, diarrhea and palpitation.  She has several months history of intermittent palpitation, but she has not been formally diagnosed with atrial or ventricular arrhythmias.  She has been on Coumadin in the past for DVT thought to be secondary to her colon cancer, but her Coumadin was stopped about 2 years ago (to the patient's recollection) since she had no recurrent disease.  Patient was doing well until the day of presentation when she started to complain of nausea, vomiting, non-bloody diarrhea and palpitation after eating milk shake. She did have some shortness of breath during tachycardia, but she denied any chest pain, radiation, diaphoresis.  She also denied any PND or orthopnea.  When EMS arrived, there initial EKG showed atrial fibrillation with rapid ventricular response rate of 151 bpm with hyper-acute T-waves in inferior lead which was originally called STEMI.  She received ASA 325 and Zofran 4 mg i.v. Prior to arrival to our ED. She was started on Cardizem drip based on EMS EKG that showed AFIB with RVR. Her EKG on arrival to our ED showed sinus tachycardia with heart rate of 143 bpm.  There was no ST-elevation on this EKG.  At this time, patient was asymptomatic, and she was hemodynamically stable.  She specifically denies chest pain or shortness of breath, and she does not appear to be in any distress.  Past Medical History  Diagnosis Date  . Cancer    Colon Cancer s/p resection  Past Surgical History  Procedure Laterality Date  . Abdominal hysterectomy    . Colon resection      No family history on file. Social History:  reports that she has never smoked. She does not have any smokeless tobacco history on file. She reports that she does not drink alcohol or use illicit  drugs.  Allergies: No Known Allergies  Medication: Lovastatin 20 mg qhs  (Not in a hospital admission)  No results found for this or any previous visit (from the past 48 hour(s)). Dg Chest Port 1 View  06/16/2012  *RADIOLOGY REPORT*  Clinical Data: Tachycardia.  PORTABLE CHEST - 1 VIEW  Comparison: 07/19/2010  Findings: Mild hyperinflation of the lungs, stable.  Heart is normal size.  No confluent opacities or effusions.  No acute bony abnormality.  IMPRESSION: No acute findings.   Original Report Authenticated By: Charlett Nose, M.D.     Review of Systems  Constitutional: Negative for fever, chills, weight loss, malaise/fatigue and diaphoresis.  HENT: Negative for hearing loss, ear pain, nosebleeds, congestion, sore throat, neck pain, tinnitus and ear discharge.   Eyes: Negative for blurred vision, double vision, photophobia, pain, discharge and redness.  Respiratory: Negative for cough, hemoptysis, sputum production, shortness of breath, wheezing and stridor.   Cardiovascular: Positive for palpitations. Negative for chest pain, orthopnea, claudication, leg swelling and PND.  Gastrointestinal: Positive for nausea, vomiting and diarrhea. Negative for heartburn, abdominal pain, constipation and blood in stool.  Genitourinary: Negative for dysuria, urgency, frequency and hematuria.  Musculoskeletal: Negative for myalgias, back pain and joint pain.  Skin: Negative for itching and rash.  Neurological: Negative for dizziness, tingling, tremors, sensory change, weakness and headaches.  Psychiatric/Behavioral: Negative for hallucinations.    There were no vitals taken for this visit. Physical Exam  Constitutional: She is oriented to person,  place, and time. No distress.  HENT:  Head: Normocephalic and atraumatic.  Eyes: Pupils are equal, round, and reactive to light. Right eye exhibits no discharge. Left eye exhibits no discharge. No scleral icterus.  Neck: No JVD present. No tracheal  deviation present. No thyromegaly present.  Cardiovascular: Exam reveals no gallop and no friction rub.   No murmur heard. Irregularly irregular rhythm  Respiratory: No stridor. No respiratory distress. She has no wheezes. She has no rales. She exhibits no tenderness.  GI: She exhibits no distension and no mass. There is no tenderness. There is no rebound and no guarding.  Musculoskeletal: She exhibits no edema and no tenderness.  Lymphadenopathy:    She has no cervical adenopathy.  Neurological: She is alert and oriented to person, place, and time.  Skin: She is not diaphoretic.  Psychiatric: She has a normal mood and affect.     Assessment/Plan  1. AFIB with RVR (patient now in sinus rhythm): etiology likely due to dehydration from several hours of nausea, vomiting and non-bloody diarrhea drinking milk shake.  Her symptoms have resolved, and her EKG now shows sinus tachycardia.  Due to several months history of intermittent tachycardia, she likely has paroxysmal AFIB. Thus, we will continue her Cardizem drip in addition to intravenous fluids with 0.9 normal saline drip, and she will be anticoagulated with Heparin drip.  We will admit her to cardiology service to be observed on telemetry, and obtain serial cardiac markers to rule MI. We will start the patient on Cardizem CD 120 mg qd when she is no longer tachycardic and when Cardizem drip.  2. HTN: is currently well-controlled.  3.  Hyperlipidemia.  Patient is on Lovastatin which will be continued.  We will obtain fasting lipids in the morning.  4.  Nausea/vomiting and diarrhea: likely secondary to drinking milk shake. Symptoms are resolved.  If she has recurrent symptoms, we will consult medicine.   Nakina Spatz E 06/16/2012, 9:33 PM

## 2012-06-16 NOTE — ED Notes (Signed)
Attempted to call report to unit 3300. RN unavailable at this time. Will call me back.

## 2012-06-16 NOTE — Progress Notes (Signed)
ANTICOAGULATION CONSULT NOTE - Initial Consult  Pharmacy Consult for heparin Indication: chest pain/ACS   No Known Allergies  Patient Measurements: Height: 5' 1.81" (157 cm) Weight: 109 lb (49.442 kg) IBW/kg (Calculated) : 49.67 Heparin Dosing Weight:   Vital Signs: BP: 119/56 mmHg (05/11 2230) Pulse Rate: 82 (05/11 2230)  Labs:  Recent Labs  06/16/12 2104 06/16/12 2224  HGB 14.8  --   HCT 44.3  --   PLT 207  --   LABPROT 13.3  --   INR 1.02  --   CREATININE 1.04  --   TROPONINI <0.30 <0.30    Estimated Creatinine Clearance: 30.3 ml/min (by C-G formula based on Cr of 1.04).   Medical History: Past Medical History  Diagnosis Date  . Cancer     Medications:   (Not in a hospital admission)  Assessment: 79 yo female initially triaged as a STEMI now apparently downgraded to afib/acs heparin per protocol Goal of Therapy:  Heparin level 0.3-0.7 units/ml Monitor platelets by anticoagulation protocol: Yes   Plan:  Heparin 2500 units x1 then 600 units/hr heparin level in 8 hours then daily if applicable. Will monitor for bleeding   Janice Coffin 06/16/2012,11:35 PM

## 2012-06-16 NOTE — ED Notes (Signed)
Pt alert, NAD, calm, interactive, skin W&D, resps e/u, speaking in clear complete setnences, denies pain or nausea, admits to some sob, lab at [facility], family at [facility].

## 2012-06-16 NOTE — ED Notes (Signed)
Patient arrived via [facility] with N/V patient was placed on the monitor and found to be a STEMI. Patient arrived and was found to be in Afib RVR per cardiology. Patient denies any chest pain. EMS gave aspring 324, zofran 4mg .

## 2012-06-17 ENCOUNTER — Encounter (HOSPITAL_COMMUNITY): Payer: Self-pay | Admitting: *Deleted

## 2012-06-17 DIAGNOSIS — I369 Nonrheumatic tricuspid valve disorder, unspecified: Secondary | ICD-10-CM

## 2012-06-17 DIAGNOSIS — R000 Tachycardia, unspecified: Secondary | ICD-10-CM

## 2012-06-17 DIAGNOSIS — I491 Atrial premature depolarization: Secondary | ICD-10-CM

## 2012-06-17 DIAGNOSIS — R112 Nausea with vomiting, unspecified: Secondary | ICD-10-CM

## 2012-06-17 LAB — LIPID PANEL
Cholesterol: 179 mg/dL (ref 0–200)
Total CHOL/HDL Ratio: 2.6 RATIO
Triglycerides: 55 mg/dL (ref <150)
VLDL: 11 mg/dL (ref 0–40)

## 2012-06-17 LAB — BASIC METABOLIC PANEL
CO2: 19 mEq/L (ref 19–32)
Chloride: 110 mEq/L (ref 96–112)
Sodium: 138 mEq/L (ref 135–145)

## 2012-06-17 LAB — CBC
HCT: 38 % (ref 36.0–46.0)
Hemoglobin: 12.4 g/dL (ref 12.0–15.0)
MCH: 28.4 pg (ref 26.0–34.0)
MCHC: 32.6 g/dL (ref 30.0–36.0)
MCV: 87 fL (ref 78.0–100.0)
Platelets: 174 10*3/uL (ref 150–400)
RBC: 4.37 MIL/uL (ref 3.87–5.11)
RDW: 15.3 % (ref 11.5–15.5)
WBC: 5.4 10*3/uL (ref 4.0–10.5)

## 2012-06-17 LAB — MRSA PCR SCREENING: MRSA by PCR: NEGATIVE

## 2012-06-17 LAB — TSH
TSH: 5.948 u[IU]/mL — ABNORMAL HIGH (ref 0.350–4.500)
TSH: 1.418 u[IU]/mL (ref 0.350–4.500)

## 2012-06-17 LAB — TROPONIN I: Troponin I: <0.3 ng/mL

## 2012-06-17 LAB — T4, FREE: Free T4: 1.28 ng/dL (ref 0.80–1.80)

## 2012-06-17 MED ORDER — METOPROLOL SUCCINATE ER 25 MG PO TB24
25.0000 mg | ORAL_TABLET | Freq: Every day | ORAL | Status: DC
  Filled 2012-06-17: qty 1

## 2012-06-17 MED ORDER — METOPROLOL SUCCINATE ER 25 MG PO TB24: 25.0000 mg | ORAL_TABLET | Freq: Every day | ORAL | Status: DC

## 2012-06-17 MED ORDER — ASPIRIN 81 MG PO TBEC: 81.0000 mg | DELAYED_RELEASE_TABLET | Freq: Every day | ORAL | Status: DC

## 2012-06-17 MED ORDER — HEPARIN (PORCINE) IN NACL 100-0.45 UNIT/ML-% IJ SOLN
750.0000 [IU]/h | INTRAMUSCULAR | Status: DC
  Filled 2012-06-17: qty 250

## 2012-06-17 NOTE — Discharge Summary (Signed)
Discharge Summary   Patient ID: Amber [name],  MRN: [id number], DOB/AGE: 1924/10/06 79 y.o.  Admit date: 06/16/2012 Discharge date: 06/17/2012  Primary Physician: Karle Plumber, MD Primary Cardiologist: seen in consultation by B. Jens Som, MD   Discharge Diagnoses Principal Problem:   Sinus tachycardia  - Admitted with suspected atrial fibrillation with RVR  - Sinus tachycardia with considerable PACs confirmed on EKG review by Dr. Jens Som  - TSH, trop-I x 4 WNL  - 2D echo: LVEF 55-60%, grade 1 dd, mild LVH, mild AS, mild AI, mild-mod TR  - Low-dose ASA and Toprol-XL added Active Problems:   HYPERLIPIDEMIA  - LDL 99, HDL 69, TG 55, TC 841  - Continue statin   PAC (premature atrial contraction)  - Toprol-XL 25mg  PO daily added   Nausea & vomiting  - Suspected viral gastroenteritis causing dehydration and driving sinus tachycardia with atrial ectopy  - Improved throughout admission with hydration  Allergies No Known Allergies  Diagnostic Studies/Procedures  PORTABLE CHEST X-RAY - 06/16/12  IMPRESSION: No acute findings.  TRANSTHORACIC ECHOCARDIOGRAM - 06/17/12  Left ventricle: The cavity size was normal. Wall thickness was increased in a pattern of mild LVH. Systolic function was normal. The estimated ejection fraction was in the range of 55% to 60%. Doppler parameters are consistent with abnormal left ventricular relaxation (grade 1 diastolic dysfunction). Aortic valve: AV is thickened, calcified with mildly restricted motion. Peak and mean gradients through the valve are 21 and 13 mm Hg respectively consistent with mild aortic stenosis. Mild regurgitation.  Tricuspid valve: Mild-moderate regurgitation.  History of Present Illness Amber [name] is a 79 y.o. female with PMHx s/f HTN, h/o DVT (prior Coumadin anticoagulation), colon CA s/p resection who was admitted to [facility] yesterday for suspected new onset atrial fibrillation with RVR.   She initially  presented to [facility] ED yesterday for new onset nausea, vomiting and diarrhea. She had noted several months of palpitations without syncope or lightheadedness. She arrived by EMS and on their initial assessment, she was found to be in a narrow, complex irregular tachycardia with hyperacute T waves suspected to be atrial fibrillation with RVR, and originally called STEMI. STEMI was cancelled in the ED, and she was placed on diltiazem IV. EKG in the ED without evidence of ischemic changes. Initial trop-I as above indicated no evidence of ischemia. CXR w/o acute abnormalities. Upon cardiology assessment, she was found to be in sinus rhythm. She was admitted for formal rule out, placed on heparin gtt, IV hydration, Cardizem PO and continued monitoring on telemetry.    Hospital Course   Rate-improved overnight with hydration. Nausea and vomiting improved. Three subsequent readings of troponin-I returned WNL. Lipid panel the following AM revealed LDL 99, HDL 69, TG 55, TC 324. TSH returned WNL. She was evaluated by Dr. Jens Som the following morning. Rhythm strips and telemetry were reviewed and confirmed to represent sinus tachycardia. There was no evidence of atrial fibrillation. Cardizem was replaced with Toprol-XL. Heparin was discontinued and low-dose ASA started. She underwent 2D echo as noted above, EF preserved. She was deemed stable for discharge. She will follow-up with Dr. Jens Som in 2-4 weeks. She will have outpatient cardiac monitoring arranged to evaluate for PAF. This information has been clearly outlined with the patient prior to discharge.   Discharge Vitals:  Blood pressure 112/58, pulse 74, temperature 98.8 F (37.1 C), temperature source Oral, resp. rate 24, height 5\' 4"  (1.626 m), weight 107 lb 5.8 oz (48.7 kg), SpO2 95.00%.  Labs: Recent Labs     06/16/12  2104  06/17/12  0510  WBC  11.5*  5.4  HGB  14.8  12.4  HCT  44.3  38.0  MCV  86.7  87.0  PLT  207  174    Recent  Labs Lab 06/16/12 2104 06/17/12 0510  NA 139 138  K 4.9 4.3  CL 106 110  CO2 18* 19  BUN 21 23  CREATININE 1.04 0.99  CALCIUM 9.4 7.7*  PROT 8.5*  --   BILITOT 0.6  --   ALKPHOS 54  --   ALT 8  --   AST 21  --   LIPASE 49  --   GLUCOSE 163* 110*   Recent Labs     06/16/12  2224  06/17/12  0415  06/17/12  1027  TROPONINI  <0.30  <0.30  <0.30   Recent Labs     06/17/12  0510  CHOL  179  HDL  69  LDLCALC  99  TRIG  55  CHOLHDL  2.6    Recent Labs  06/17/12 1045  TSH 1.418    Disposition:       Follow-up Information   Follow up with [facility]. (Office will call you with an appointment date & time to arrange outpatient monitor and follow-up with Dr. Olga Millers. )    Contact information:   [address] [place] [address]       Discharge Medications:    Medication List    TAKE these medications       aspirin 81 MG EC tablet  Take 1 tablet (81 mg total) by mouth daily.     aspirin-sod bicarb-citric acid 325 MG Tbef  Commonly known as:  ALKA-SELTZER  Take 325 mg by mouth daily as needed. For heartburn/indigestion     cetirizine 10 MG tablet  Commonly known as:  ZYRTEC  Take 10 mg by mouth daily.     fluticasone 50 MCG/ACT nasal spray  Commonly known as:  FLONASE  Place 2 sprays into the nose daily as needed. For allergy symptoms     lovastatin 40 MG tablet  Commonly known as:  MEVACOR  Take 40 mg by mouth at bedtime.     metoprolol succinate 25 MG 24 hr tablet  Commonly known as:  TOPROL-XL  Take 1 tablet (25 mg total) by mouth daily.       Outstanding Labs/Studies: Cardionet outpatient monitor  Duration of Discharge Encounter: Greater than 30 minutes including physician time.  Signed, R. Hurman Horn, PA-C 06/17/2012, 6:28 PM

## 2012-06-17 NOTE — Progress Notes (Signed)
   Subjective:  Denies CP or dyspnea, nausea, vomiting and diarrhea improved.   Objective:  Filed Vitals:   06/17/12 0305 06/17/12 0550 06/17/12 0703 06/17/12 0800  BP: 132/47 120/43 117/48   Pulse: 85 74    Temp: 99.3 F (37.4 C)   98.4 F (36.9 C)  TempSrc: Oral  Oral Oral  Resp:      Height:      Weight:      SpO2: 95% 95%      Intake/Output from previous day:  Intake/Output Summary (Last 24 hours) at 06/17/12 0909 Last data filed at 06/17/12 0801  Gross per 24 hour  Intake 718.65 ml  Output    100 ml  Net 618.65 ml    Physical Exam: Physical exam: Well-developed frail in no acute distress.  Skin is warm and dry.  HEENT is normal.  Neck is supple. No thyromegaly.  Chest is clear to auscultation with normal expansion.  Cardiovascular exam is regular rate and rhythm.  Abdominal exam nontender or distended. No masses palpated. Extremities show no edema. neuro grossly intact    Lab Results: Basic Metabolic Panel:  Recent Labs  16/10/96 2104 06/17/12 0510  NA 139 138  K 4.9 4.3  CL 106 110  CO2 18* 19  GLUCOSE 163* 110*  BUN 21 23  CREATININE 1.04 0.99  CALCIUM 9.4 7.7*   CBC:  Recent Labs  06/16/12 2104 06/17/12 0510  WBC 11.5* 5.4  NEUTROABS 10.7*  --   HGB 14.8 12.4  HCT 44.3 38.0  MCV 86.7 87.0  PLT 207 174   Cardiac Enzymes:  Recent Labs  06/16/12 2104 06/16/12 2224 06/17/12 0415  TROPONINI <0.30 <0.30 <0.30     Assessment/Plan:  1 question atrial fibrillation-I have reviewed the patient's electrocardiograms. There appears to be sinus tachycardia with PACs. I have not seen evidence of atrial fibrillation. I will discontinue Cardizem and heparin. Treat with aspirin and Toprol 25 mg daily. Check echocardiogram and TSH. If LV function normal plan to discharge later today with outpatient CardioNet. She does have a history of palpitations. She will followup with me in 2-4 weeks. Enzymes neg. 2 nausea vomiting/diarrhea-question viral  gastroenteritis-improved. Some of tachycardia on presentation most likely related to nausea/vomiting and dehydration. 3 hyperlipidemia-continue statin.  Amber [name] 06/17/2012, 9:09 AM

## 2012-06-17 NOTE — Progress Notes (Signed)
Utilization review completed.  

## 2012-06-17 NOTE — Progress Notes (Signed)
ANTICOAGULATION CONSULT NOTE - Initial Consult  Pharmacy Consult for heparin Indication: chest pain/ACS   No Known Allergies  Patient Measurements: Height: 5\' 4"  (162.6 cm) Weight: 107 lb 5.8 oz (48.7 kg) IBW/kg (Calculated) : 54.7 Heparin Dosing Weight:   Vital Signs: Temp: 98.4 F (36.9 C) (05/12 0800) Temp src: Oral (05/12 0800) BP: 117/48 mmHg (05/12 0703) Pulse Rate: 74 (05/12 0550)  Labs:  Recent Labs  06/16/12 2104 06/16/12 2224 06/17/12 0415 06/17/12 0510 06/17/12 0756  HGB 14.8  --   --  12.4  --   HCT 44.3  --   --  38.0  --   PLT 207  --   --  174  --   LABPROT 13.3  --   --   --   --   INR 1.02  --   --   --   --   HEPARINUNFRC  --   --   --   --  0.18*  CREATININE 1.04  --   --  0.99  --   TROPONINI <0.30 <0.30 <0.30  --   --     Estimated Creatinine Clearance: 31.4 ml/min (by C-G formula based on Cr of 0.99).   Medical History: Past Medical History  Diagnosis Date  . Cancer     Medications:  Prescriptions prior to admission  Medication Sig Dispense Refill  . aspirin-sod bicarb-citric acid (ALKA-SELTZER) 325 MG TBEF Take 325 mg by mouth daily as needed. For heartburn/indigestion      . cetirizine (ZYRTEC) 10 MG tablet Take 10 mg by mouth daily.      . fluticasone (FLONASE) 50 MCG/ACT nasal spray Place 2 sprays into the nose daily as needed. For allergy symptoms      . lovastatin (MEVACOR) 40 MG tablet Take 40 mg by mouth at bedtime.        Assessment: On heparin for new onset afib. Heparin level came back slight subtherapeutic.   Goal of Therapy:  Heparin level 0.3-0.7 units/ml Monitor platelets by anticoagulation protocol: Yes   Plan:  Increase heparin to 750 units/hr F/u with 8 hr level

## 2012-06-17 NOTE — Progress Notes (Signed)
Echocardiogram 2D Echocardiogram has been performed.  Amber [name] 06/17/2012, 2:09 PM

## 2012-06-17 NOTE — Progress Notes (Signed)
Pt discharged home per MD order with daughter, Rosann Auerbach. Discharge instructions given, explained and all questions answered. All instructions printed and given to daughter.

## 2012-06-18 NOTE — Discharge Summary (Signed)
See progress notes Amber [name]  

## 2012-06-20 ENCOUNTER — Telehealth: Payer: Self-pay | Admitting: *Deleted

## 2012-06-20 ENCOUNTER — Encounter (INDEPENDENT_AMBULATORY_CARE_PROVIDER_SITE_OTHER): Payer: Medicare Other

## 2012-06-20 ENCOUNTER — Encounter: Payer: Self-pay | Admitting: Cardiology

## 2012-06-20 DIAGNOSIS — R000 Tachycardia, unspecified: Secondary | ICD-10-CM

## 2012-06-20 DIAGNOSIS — I491 Atrial premature depolarization: Secondary | ICD-10-CM

## 2012-06-20 NOTE — Telephone Encounter (Signed)
30 Day e-cardio event monitor placed on patient.

## 2012-07-26 ENCOUNTER — Telehealth: Payer: Self-pay | Admitting: *Deleted

## 2012-07-26 NOTE — Telephone Encounter (Signed)
Unable to reach pt or leave a message, monitor reviewed by dr Jens Som shows sinus with PVC's

## 2012-08-01 ENCOUNTER — Encounter: Payer: Self-pay | Admitting: Cardiology

## 2012-08-01 ENCOUNTER — Encounter: Payer: Self-pay | Admitting: *Deleted

## 2012-08-01 NOTE — Telephone Encounter (Signed)
Spoke with pt, aware of monitor results. 

## 2012-08-05 ENCOUNTER — Encounter: Payer: Medicare Other | Admitting: Cardiology

## 2012-08-05 NOTE — Progress Notes (Signed)
   HPI: 79 year old female for followup of palpitations. Patient was admitted to [facility] in May of 2014 with nausea/vomiting and diarrhea. She was felt to potentially be in atrial fibrillation. I did review the electrocardiograms and felt it was sinus tachycardia with PACs. Echo showed normal LV function with an ejection fraction of 55-60%, mild left ventricular hypertrophy, grade 1 diastolic dysfunction and mild aortic stenosis/aortic insufficiency. Her mean gradient was 13 mm of mercury. There was mild to moderate tricuspid regurgitation. Patient treated with aspirin and Toprol. Since she was discharged,   Current Outpatient Prescriptions  Medication Sig Dispense Refill  . aspirin EC 81 MG EC tablet Take 1 tablet (81 mg total) by mouth daily.      Marland Kitchen aspirin-sod bicarb-citric acid (ALKA-SELTZER) 325 MG TBEF Take 325 mg by mouth daily as needed. For heartburn/indigestion      . cetirizine (ZYRTEC) 10 MG tablet Take 10 mg by mouth daily.      . fluticasone (FLONASE) 50 MCG/ACT nasal spray Place 2 sprays into the nose daily as needed. For allergy symptoms      . lovastatin (MEVACOR) 40 MG tablet Take 40 mg by mouth at bedtime.      . metoprolol succinate (TOPROL-XL) 25 MG 24 hr tablet Take 1 tablet (25 mg total) by mouth daily.  30 tablet  3   No current facility-administered medications for this visit.     Past Medical History  Diagnosis Date  . Cancer   . HYPERLIPIDEMIA   . OPEN WOUND FINGER WITHOUT MENTION COMPLICATION   . Sinus tachycardia   . PAC (premature atrial contraction)   . Nausea & vomiting     Past Surgical History  Procedure Laterality Date  . Abdominal hysterectomy    . Colon resection      History   Social History  . Marital Status: Divorced    Spouse Name: N/A    Number of Children: N/A  . Years of Education: N/A   Occupational History  . Not on file.   Social History Main Topics  . Smoking status: Never Smoker   . Smokeless tobacco: Not on  file  . Alcohol Use: No  . Drug Use: No  . Sexually Active:    Other Topics Concern  . Not on file   Social History Narrative  . No narrative on file    ROS: no fevers or chills, productive cough, hemoptysis, dysphasia, odynophagia, melena, hematochezia, dysuria, hematuria, rash, seizure activity, orthopnea, PND, pedal edema, claudication. Remaining systems are negative.  Physical Exam: Well-developed well-nourished in no acute distress.  Skin is warm and dry.  HEENT is normal.  Neck is supple.  Chest is clear to auscultation with normal expansion.  Cardiovascular exam is regular rate and rhythm.  Abdominal exam nontender or distended. No masses palpated. Extremities show no edema. neuro grossly intact  ECG     This encounter was created in error - please disregard.

## 2012-09-16 ENCOUNTER — Encounter: Payer: Self-pay | Admitting: Cardiology

## 2012-11-13 ENCOUNTER — Encounter: Payer: Self-pay | Admitting: Cardiology

## 2012-11-13 ENCOUNTER — Ambulatory Visit (INDEPENDENT_AMBULATORY_CARE_PROVIDER_SITE_OTHER): Payer: Medicare Other | Admitting: Cardiology

## 2012-11-13 VITALS — BP 176/80 | HR 68 | Ht 63.0 in | Wt 110.0 lb

## 2012-11-13 DIAGNOSIS — I10 Essential (primary) hypertension: Secondary | ICD-10-CM | POA: Insufficient documentation

## 2012-11-13 DIAGNOSIS — I498 Other specified cardiac arrhythmias: Secondary | ICD-10-CM

## 2012-11-13 DIAGNOSIS — R000 Tachycardia, unspecified: Secondary | ICD-10-CM

## 2012-11-13 DIAGNOSIS — E785 Hyperlipidemia, unspecified: Secondary | ICD-10-CM

## 2012-11-13 DIAGNOSIS — R002 Palpitations: Secondary | ICD-10-CM | POA: Insufficient documentation

## 2012-11-13 MED ORDER — METOPROLOL SUCCINATE ER 50 MG PO TB24: 50.0000 mg | ORAL_TABLET | Freq: Every day | ORAL | Status: DC

## 2012-11-13 NOTE — Assessment & Plan Note (Signed)
Management per primary care. 

## 2012-11-13 NOTE — Assessment & Plan Note (Signed)
These appeared to be related to PACs and PVCs. I reviewed her electrocardiograms in the hospital and she did not have atrial fibrillation. Followup monitor also did not show atrial fibrillation but showed only PVCs. I do not think therefore anticoagulation is indicated. Continue beta blocker for premature beats.

## 2012-11-13 NOTE — Patient Instructions (Signed)
Your physician recommends that you schedule a follow-up appointment in: AS NEEDED  INCREASE METOPROLOL TO 50 MG ONCE DAILY

## 2012-11-13 NOTE — Assessment & Plan Note (Signed)
Blood pressure is elevated. Increase Toprol to 50 mg daily. Followup primary care for further adjustment.

## 2012-11-13 NOTE — Progress Notes (Signed)
      HPI: 79 year old female for followup of palpitations. Admitted in May of 2014 with nausea and vomiting. There was an initial question of whether she was in atrial fibrillation. However review of her telemetry and electrocardiograms showed sinus tachycardia with PACs. Echocardiogram in May 2014 showed normal LV function, grade 1 diastolic dysfunction and mild aortic stenosis with a mean gradient of 13 mm of mercury. Mild aortic insufficiency. Mild to moderate tricuspid regurgitation. Monitor in June of 2014 showed sinus rhythm with PVCs. Since she was discharged, she has mild dyspnea with more moderate activities but not routine activities. No orthopnea, PND, pedal edema, chest pain or syncope. Occasional brief palpitations.   Current Outpatient Prescriptions  Medication Sig Dispense Refill  . aspirin EC 81 MG EC tablet Take 1 tablet (81 mg total) by mouth daily.      Marland Kitchen aspirin-sod bicarb-citric acid (ALKA-SELTZER) 325 MG TBEF Take 325 mg by mouth daily as needed. For heartburn/indigestion      . cetirizine (ZYRTEC) 10 MG tablet Take 10 mg by mouth as needed.       . fluticasone (FLONASE) 50 MCG/ACT nasal spray Place 2 sprays into the nose daily as needed. For allergy symptoms      . lovastatin (MEVACOR) 40 MG tablet Take 40 mg by mouth at bedtime.      . metoprolol succinate (TOPROL-XL) 25 MG 24 hr tablet Take 1 tablet (25 mg total) by mouth daily.  30 tablet  3  . nystatin (MYCOSTATIN) 100000 UNIT/ML suspension        No current facility-administered medications for this visit.     Past Medical History  Diagnosis Date  . Cancer   . HYPERLIPIDEMIA   . OPEN WOUND FINGER WITHOUT MENTION COMPLICATION   . Sinus tachycardia   . PAC (premature atrial contraction)   . Nausea & vomiting     Past Surgical History  Procedure Laterality Date  . Abdominal hysterectomy    . Colon resection      History   Social History  . Marital Status: Divorced    Spouse Name: N/A    Number of  Children: N/A  . Years of Education: N/A   Occupational History  . Not on file.   Social History Main Topics  . Smoking status: Never Smoker   . Smokeless tobacco: Not on file  . Alcohol Use: No  . Drug Use: No  . Sexual Activity:    Other Topics Concern  . Not on file   Social History Narrative  . No narrative on file    ROS: no fevers or chills, productive cough, hemoptysis, dysphasia, odynophagia, melena, hematochezia, dysuria, hematuria, rash, seizure activity, orthopnea, PND, pedal edema, claudication. Remaining systems are negative.  Physical Exam: Well-developed well-nourished in no acute distress.  Skin is warm and dry.  HEENT is normal.  Neck is supple.  Chest is clear to auscultation with normal expansion.  Cardiovascular exam is regular rate and rhythm.  Abdominal exam nontender or distended. No masses palpated. Extremities show no edema. neuro grossly intact  ECG sinus rhythm at a rate of 68. Normal axis. Left ventricular hypertrophy. Cannot rule out anterior infarct.

## 2013-03-25 IMAGING — CR DG RIBS W/ CHEST 3+V*L*
3 series · 3 of 3 positions shown · non-contrast
Comparison: None.

CLINICAL DATA: Trauma, fall

LEFT RIBS AND CHEST - 3+ VIEW

[t chest supine]
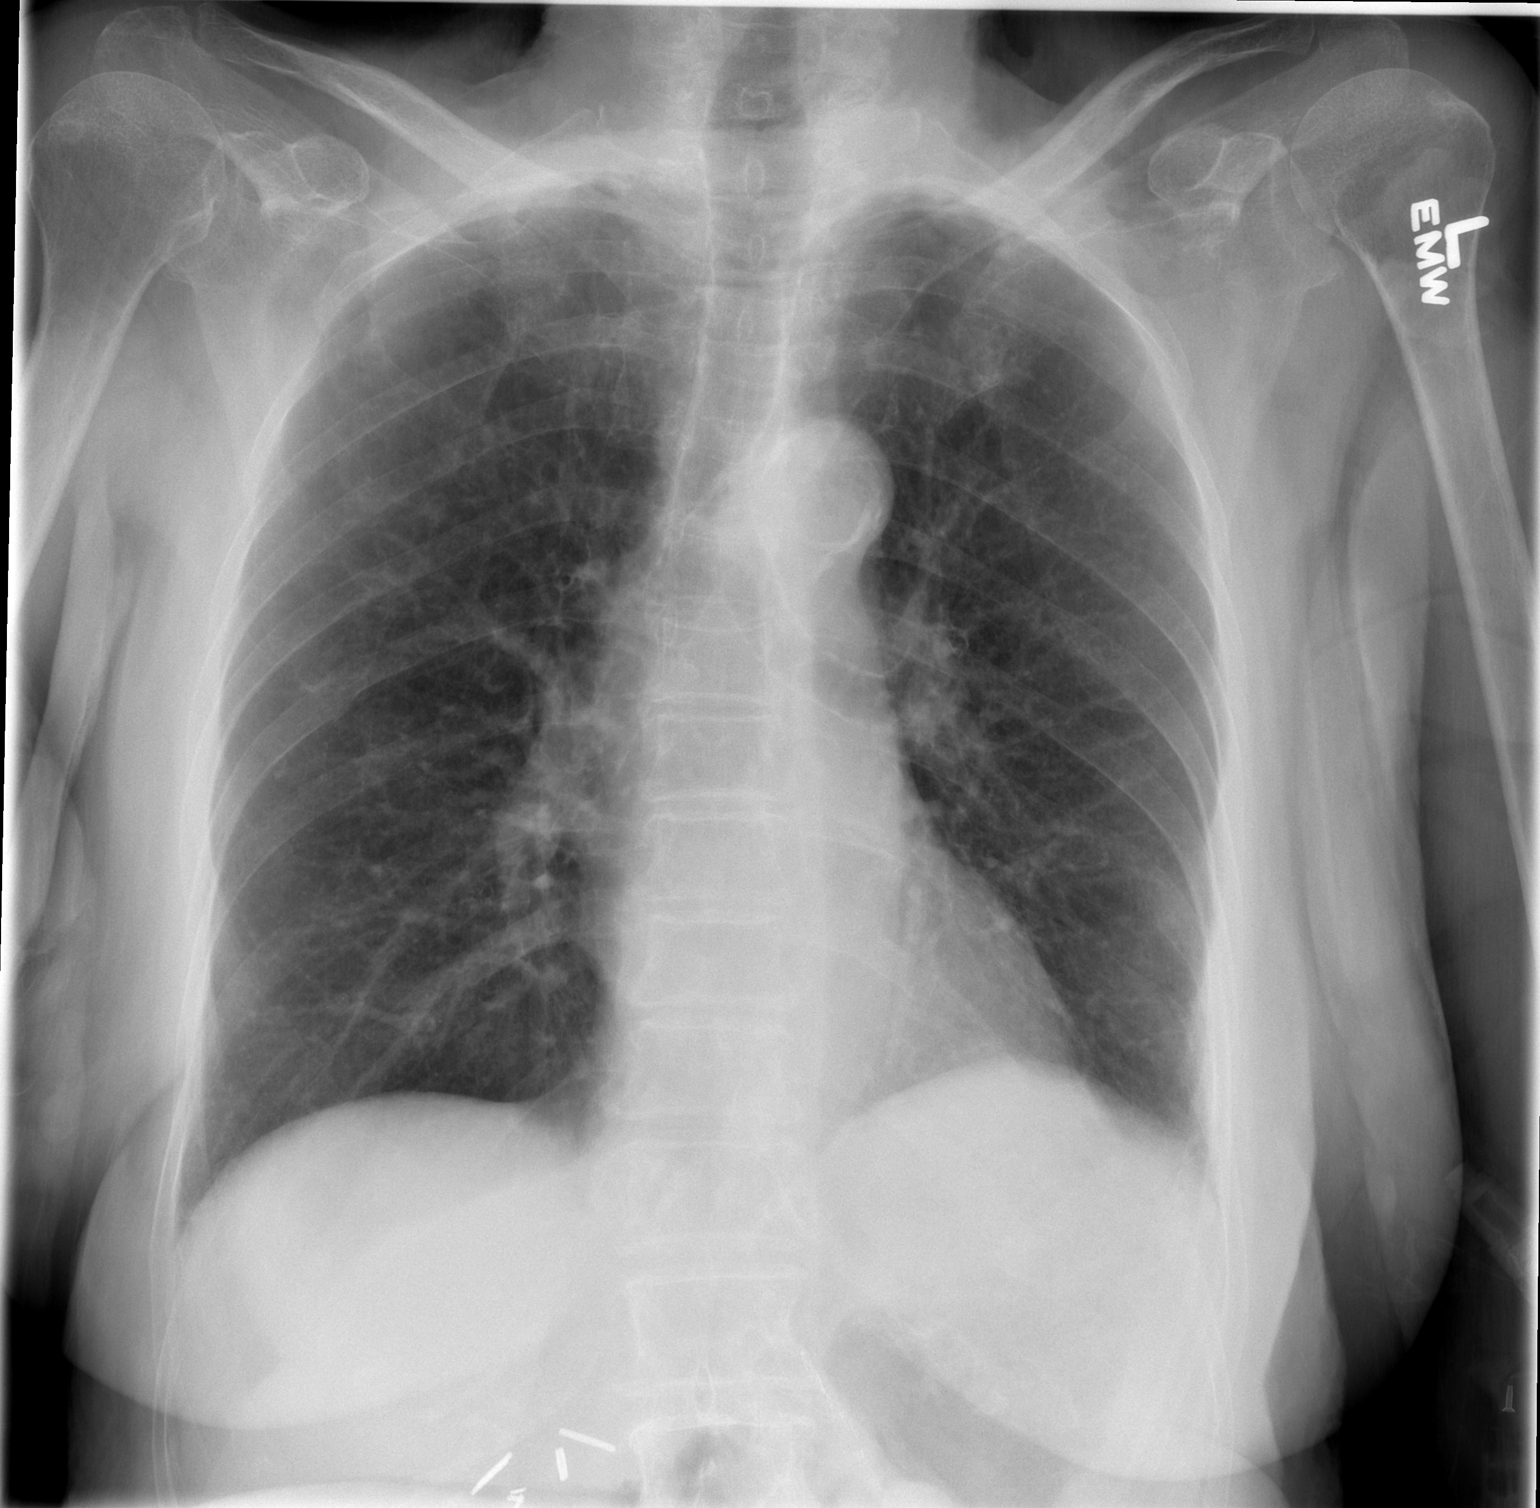

[t ribs ap/pa upper left]
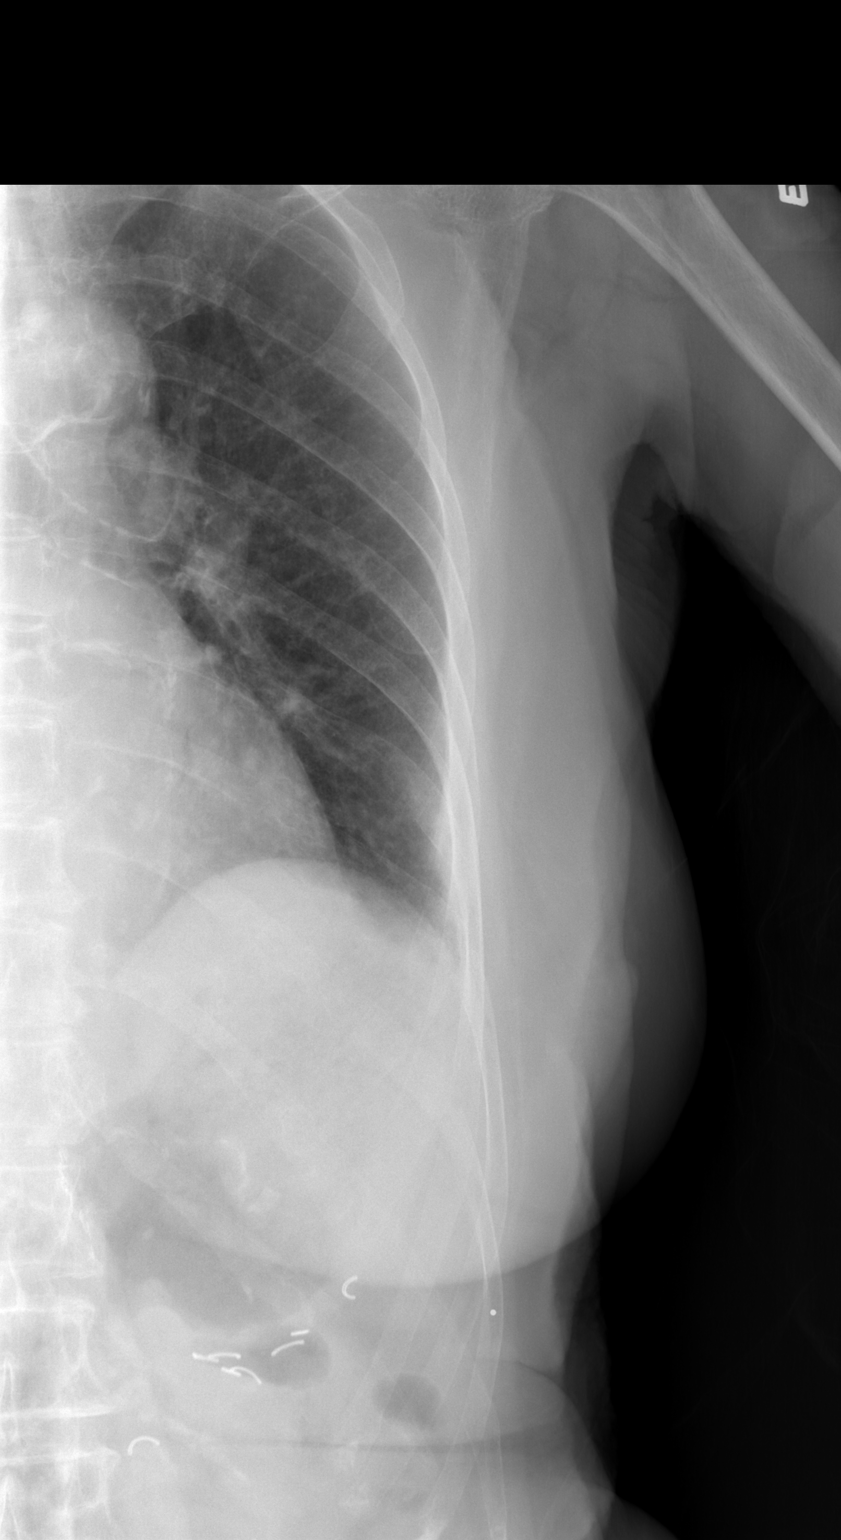

[t ribs ap/pa  lower left]
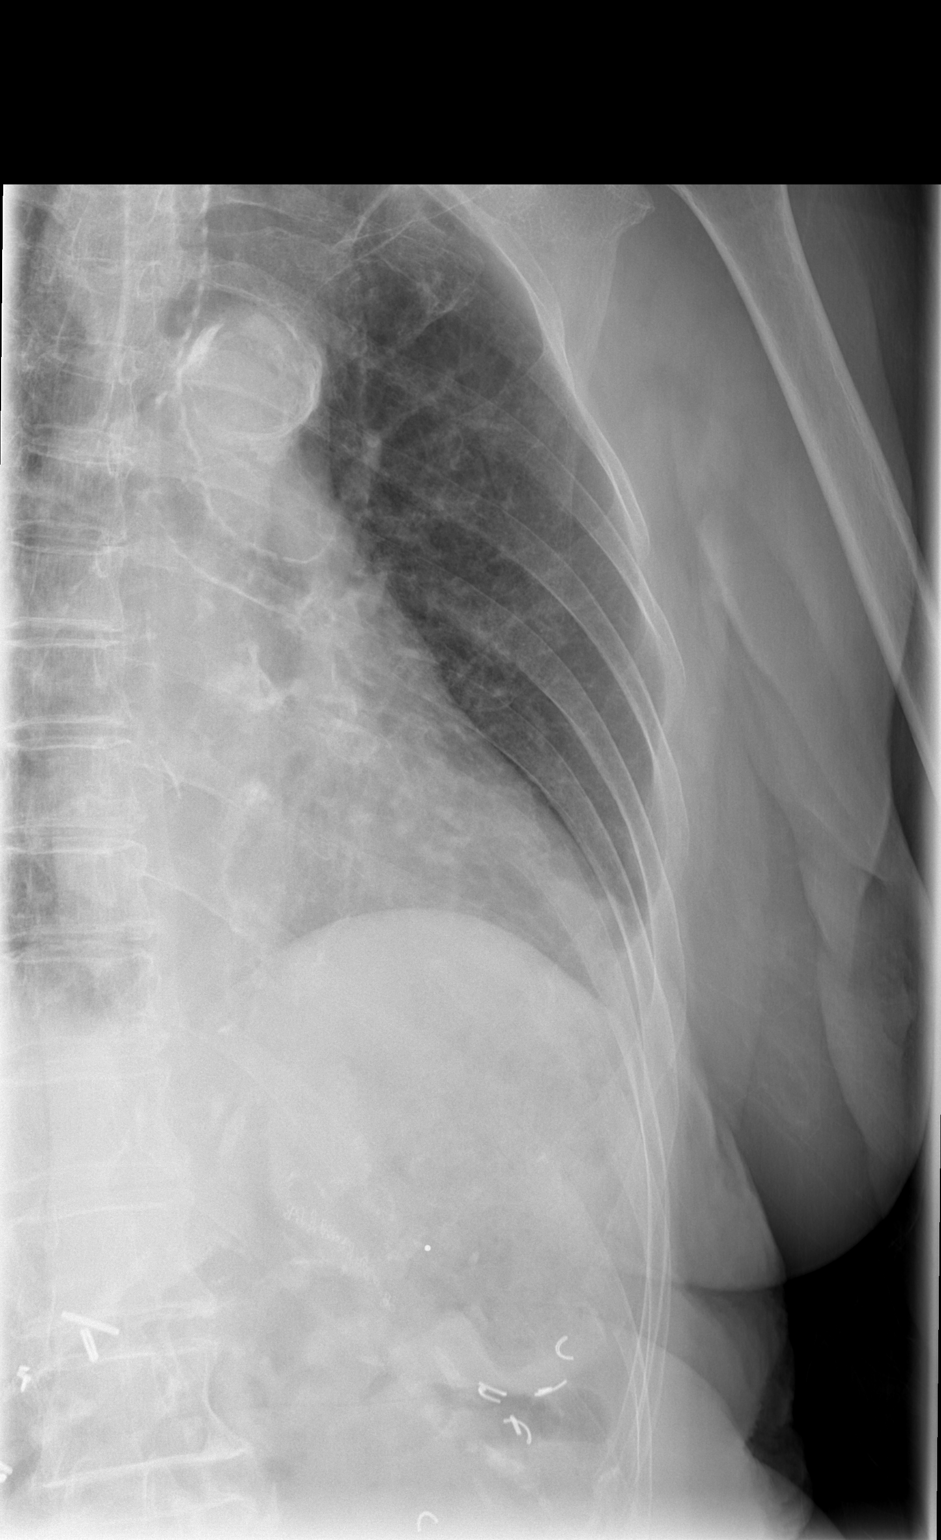

[3 of 3 positions shown; findings below may reference images not displayed]

FINDINGS: Normal mediastinum and heart silhouette.  No evidence of
pleural fluid, pulmonary contusion, or pneumothorax.  Dedicated
views of the left ribs demonstrate a minimally displaced fracture
of the posterior left eighth rib.
IMPRESSION: Nondisplaced fracture of posterior lateral left eighth rib.  No
pneumothorax.

## 2013-07-03 DIAGNOSIS — M8580 Other specified disorders of bone density and structure, unspecified site: Secondary | ICD-10-CM | POA: Insufficient documentation

## 2013-12-10 ENCOUNTER — Other Ambulatory Visit: Payer: Self-pay | Admitting: Cardiology

## 2013-12-10 DIAGNOSIS — R000 Tachycardia, unspecified: Secondary | ICD-10-CM

## 2013-12-10 MED ORDER — METOPROLOL SUCCINATE ER 50 MG PO TB24: 50.0000 mg | ORAL_TABLET | Freq: Every day | ORAL | Status: DC

## 2013-12-11 ENCOUNTER — Other Ambulatory Visit: Payer: Self-pay

## 2013-12-11 DIAGNOSIS — R000 Tachycardia, unspecified: Secondary | ICD-10-CM

## 2013-12-11 MED ORDER — METOPROLOL SUCCINATE ER 50 MG PO TB24: 50.0000 mg | ORAL_TABLET | Freq: Every day | ORAL | Status: DC

## 2014-04-01 ENCOUNTER — Encounter: Payer: Self-pay | Admitting: Physician Assistant

## 2014-04-01 ENCOUNTER — Ambulatory Visit (INDEPENDENT_AMBULATORY_CARE_PROVIDER_SITE_OTHER): Payer: Medicare Other | Admitting: Physician Assistant

## 2014-04-01 VITALS — BP 159/79 | HR 89 | Temp 98.0°F | Resp 16 | Ht 62.0 in | Wt 109.5 lb

## 2014-04-01 DIAGNOSIS — R002 Palpitations: Secondary | ICD-10-CM

## 2014-04-01 DIAGNOSIS — Z85038 Personal history of other malignant neoplasm of large intestine: Secondary | ICD-10-CM | POA: Insufficient documentation

## 2014-04-01 DIAGNOSIS — I7300 Raynaud's syndrome without gangrene: Secondary | ICD-10-CM

## 2014-04-01 DIAGNOSIS — Z87898 Personal history of other specified conditions: Secondary | ICD-10-CM | POA: Insufficient documentation

## 2014-04-01 DIAGNOSIS — Z86718 Personal history of other venous thrombosis and embolism: Secondary | ICD-10-CM | POA: Insufficient documentation

## 2014-04-01 MED ORDER — AMLODIPINE BESYLATE 2.5 MG PO TABS: 2.5000 mg | ORAL_TABLET | Freq: Every day | ORAL | Status: DC

## 2014-04-01 NOTE — Patient Instructions (Signed)
Please take your chronic medications as directed.   Add on the amlodipine taking each morning. Stay very hydrated on this medication.  Always remember to get gloves on your hand when going outside in cold weather!  I want to see you in 3-4 weeks.

## 2014-04-01 NOTE — Progress Notes (Signed)
Pre visit review using our clinic review tool, if applicable. No additional management support is needed unless otherwise documented below in the visit note/SLS  

## 2014-04-06 DIAGNOSIS — I7300 Raynaud's syndrome without gangrene: Secondary | ICD-10-CM | POA: Insufficient documentation

## 2014-04-06 NOTE — Progress Notes (Signed)
Patient presents to clinic today to establish care.  Patient with history of tachycardia and palpitations, followed by Cardiology. Is currently on Toprol XL 50 mg daily. Denies breakthrough symptoms on this regimen.  Patient complains of pain, numbness and discoloration of the distal aspect of her phalanges when exposed to cold.  Endorses this started after moving to [place] from [place] last fall.  Fingers turn white -- purple -- dark purple and become very painful.  Past Medical History  Diagnosis Date  . Cancer     Stomach & Colon  . HYPERLIPIDEMIA   . OPEN WOUND FINGER WITHOUT MENTION COMPLICATION   . Sinus tachycardia   . PAC (premature atrial contraction)   . Nausea & vomiting   . Arthritis   . Hypertension   . Phlebitis     Past Surgical History  Procedure Laterality Date  . Abdominal hysterectomy  1973  . Colon resection  1999  . Stomach surgery  1985  . Kidney surgery  1970s    Detached  . Lung surgery  2011    Punctured  . Appendectomy  1939  . Multiple tooth extractions      Current Outpatient Prescriptions on File Prior to Visit  Medication Sig Dispense Refill  . aspirin-sod bicarb-citric acid (ALKA-SELTZER) 325 MG TBEF Take 325 mg by mouth daily as needed. For heartburn/indigestion    . metoprolol succinate (TOPROL-XL) 50 MG 24 hr tablet Take 1 tablet (50 mg total) by mouth daily. 30 tablet 6   No current facility-administered medications on file prior to visit.    Allergies  Allergen Reactions  . Tramadol Nausea And Vomiting    Family History  Problem Relation Age of Onset  . Ovarian cancer Mother 51    Deceased  . Lung cancer Mother   . Asthma Mother   . Heart disease Daughter   . Emphysema Daughter   . Brain cancer Daughter   . Stroke Son   . Heart disease Sister     History   Social History  . Marital Status: Divorced    Spouse Name: N/A  . Number of Children: N/A  . Years of Education: N/A   Occupational History  . Not  on file.   Social History Main Topics  . Smoking status: Former Research scientist (life sciences)  . Smokeless tobacco: Not on file     Comment: Greater >30 years  . Alcohol Use: No  . Drug Use: No  . Sexual Activity: Not on file   Other Topics Concern  . Not on file   Social History Narrative   ROS Pertinent ROS are listed in the HPI.  BP 159/79 mmHg  Pulse 89  Temp(Src) 98 F (36.7 C) (Oral)  Resp 16  Ht 5\' 2"  (1.575 m)  Wt 109 lb 8 oz (49.669 kg)  BMI 20.02 kg/m2  SpO2 100%  Physical Exam  Constitutional: She is oriented to person, place, and time and well-developed, well-nourished, and in no distress.  HENT:  Head: Normocephalic and atraumatic.  Cardiovascular: Normal rate, regular rhythm, normal heart sounds and intact distal pulses.   Pulmonary/Chest: Effort normal and breath sounds normal.  Neurological: She is alert and oriented to person, place, and time.  Skin: Skin is warm and dry. No rash noted.  Psychiatric: Affect normal.  Vitals reviewed.  Assessment/Plan: Raynaud phenomenon BP hypertensive despite Toprol 50 mg daily. Will add on amlodipine 2.5 mg daily to help dilate distal blood vessels.  Supportive measures -- gloves, etc discussed  with the patient and her daughter.  Follow-up in 4 weeks.   Palpitations Well-controlled.  Continue current regimen. Follow-up with Cardiology as scheduled.

## 2014-04-06 NOTE — Assessment & Plan Note (Signed)
Well-controlled.  Continue current regimen. Follow-up with Cardiology as scheduled.

## 2014-04-06 NOTE — Assessment & Plan Note (Signed)
BP hypertensive despite Toprol 50 mg daily. Will add on amlodipine 2.5 mg daily to help dilate distal blood vessels.  Supportive measures -- gloves, etc discussed with the patient and her daughter.  Follow-up in 4 weeks.

## 2014-04-22 ENCOUNTER — Ambulatory Visit (INDEPENDENT_AMBULATORY_CARE_PROVIDER_SITE_OTHER): Payer: Medicare Other | Admitting: Physician Assistant

## 2014-04-22 ENCOUNTER — Encounter: Payer: Self-pay | Admitting: Physician Assistant

## 2014-04-22 VITALS — BP 148/66 | HR 80 | Temp 97.7°F | Resp 14 | Ht 62.0 in | Wt 107.2 lb

## 2014-04-22 DIAGNOSIS — I7300 Raynaud's syndrome without gangrene: Secondary | ICD-10-CM

## 2014-04-22 DIAGNOSIS — I10 Essential (primary) hypertension: Secondary | ICD-10-CM

## 2014-04-22 NOTE — Assessment & Plan Note (Signed)
We'll use amlodipine 2.5 mg during cold weather, to help mitigate symptoms. Supportive measures readdressed patient and daughter.

## 2014-04-22 NOTE — Progress Notes (Signed)
Pre visit review using our clinic review tool, if applicable. No additional management support is needed unless otherwise documented below in the visit note/SLS  

## 2014-04-22 NOTE — Progress Notes (Signed)
Patient presents to clinic today for follow-up of her Raynaud's disease. Patient was started on 2.5 mg of amlodipine daily. Endorses improvement in her symptoms during the cold weather spells. Is not taking medication now as the weather is warmer.    Of note BP mildly elevated today. Patient prescribed Toprol-XL 50 mg daily. Has not taken in several days. Patient denies chest pain, palpitations, lightheadedness, dizziness, vision changes or frequent headaches.   Past Medical History  Diagnosis Date  . Cancer     Stomach & Colon  . HYPERLIPIDEMIA   . OPEN WOUND FINGER WITHOUT MENTION COMPLICATION   . Sinus tachycardia   . PAC (premature atrial contraction)   . Nausea & vomiting   . Arthritis   . Hypertension   . Phlebitis     Current Outpatient Prescriptions on File Prior to Visit  Medication Sig Dispense Refill  . amLODipine (NORVASC) 2.5 MG tablet Take 1 tablet (2.5 mg total) by mouth daily. 30 tablet 1  . aspirin-sod bicarb-citric acid (ALKA-SELTZER) 325 MG TBEF Take 325 mg by mouth daily as needed. For heartburn/indigestion    . metoprolol succinate (TOPROL-XL) 50 MG 24 hr tablet Take 1 tablet (50 mg total) by mouth daily. 30 tablet 6   No current facility-administered medications on file prior to visit.    Allergies  Allergen Reactions  . Tramadol Nausea And Vomiting    Family History  Problem Relation Age of Onset  . Ovarian cancer Mother 55    Deceased  . Lung cancer Mother   . Asthma Mother   . Heart disease Daughter   . Emphysema Daughter   . Brain cancer Daughter   . Stroke Son   . Heart disease Sister     History   Social History  . Marital Status: Divorced    Spouse Name: N/A  . Number of Children: N/A  . Years of Education: N/A   Social History Main Topics  . Smoking status: Former Research scientist (life sciences)  . Smokeless tobacco: Not on file     Comment: Greater >30 years  . Alcohol Use: No  . Drug Use: No  . Sexual Activity: Not on file   Other Topics  Concern  . None   Social History Narrative   Review of Systems - See HPI.  All other ROS are negative.  BP 148/66 mmHg  Pulse 80  Temp(Src) 97.7 F (36.5 C) (Oral)  Resp 14  Ht 5\' 2"  (1.575 m)  Wt 107 lb 4 oz (48.648 kg)  BMI 19.61 kg/m2  SpO2 100%  Physical Exam  Constitutional: She is oriented to person, place, and time and well-developed, well-nourished, and in no distress.  HENT:  Head: Normocephalic and atraumatic.  Cardiovascular: Normal rate, regular rhythm, normal heart sounds and intact distal pulses.   Pulses:      Radial pulses are 2+ on the right side, and 2+ on the left side.  Good capillary refill noted bilaterally.  Pulmonary/Chest: Effort normal and breath sounds normal. No respiratory distress. She has no wheezes. She has no rales. She exhibits no tenderness.  Neurological: She is alert and oriented to person, place, and time.  Skin: Skin is warm and dry. No rash noted.  Psychiatric: Affect normal.  Vitals reviewed.   Assessment/Plan: Raynaud phenomenon We'll use amlodipine 2.5 mg during cold weather, to help mitigate symptoms. Supportive measures readdressed patient and daughter.   Essential hypertension Risk of noncompliance with antihypertensive medications discussed with patient and daughter. Daughter is to help  make sure her mother is taking her metoprolol daily as directed. Follow-up within the next month for a complete physical.

## 2014-04-22 NOTE — Assessment & Plan Note (Signed)
Risk of noncompliance with antihypertensive medications discussed with patient and daughter. Daughter is to help make sure her mother is taking her metoprolol daily as directed. Follow-up within the next month for a complete physical.

## 2014-04-22 NOTE — Patient Instructions (Signed)
Please continue the Metoprolol as directed. Only use the amlodipine in cold weather for the Raynaud's. Remember to keep your hands gloved in cold weather.  Follow-up at your earliest convenience for a physical.

## 2014-05-12 ENCOUNTER — Telehealth: Payer: Self-pay | Admitting: Physician Assistant

## 2014-05-12 NOTE — Telephone Encounter (Signed)
Caller name:Trish Quentin Cornwall Relation to [id number] Call back [facility]  Reason for call: pt would like to know if [facility] can provide her an rx for magic mouth wash, pt mouth gets broke out and sore all the time due to no teeth, and her previous doctor would provide the magic mouth wash which seems to help.

## 2014-05-13 MED ORDER — MAGIC MOUTHWASH: 5.0000 mL | Freq: Three times a day (TID) | ORAL | Status: DC | PRN

## 2014-05-13 NOTE — Telephone Encounter (Signed)
[facility] was the only pharmacy listed in patient's EMR, so Rx was in inadvertently sent there by provider; [facility] has been added to patient demographics and Rx faxed to them; [facility] to inform [facility]

## 2014-05-13 NOTE — Telephone Encounter (Signed)
Will grant.  Rx faxed to pharmacy.

## 2014-05-13 NOTE — Telephone Encounter (Signed)
Caller name:Kelly Relationship to patient: Can be [phone] Pharmacy:  Reason for call:[facility]ing from [facility], qty on RX states 67ml order is written 6ml. Needs call back to verify. Phone note states [facility] but was [facility] to [facility]. Please advise

## 2014-05-14 ENCOUNTER — Other Ambulatory Visit: Payer: Self-pay

## 2014-05-14 ENCOUNTER — Telehealth: Payer: Self-pay

## 2014-05-14 MED ORDER — MAGIC MOUTHWASH: 5.0000 mL | Freq: Three times a day (TID) | ORAL | Status: DC | PRN

## 2014-05-14 MED ORDER — MAGIC MOUTHWASH: 5.0000 mL | Freq: Three times a day (TID) | ORAL | Status: AC | PRN

## 2014-05-14 NOTE — Addendum Note (Signed)
Addended by: Reino Bellis on: 05/14/2014 04:15 PM   Modules accepted: Orders

## 2014-05-14 NOTE — Telephone Encounter (Signed)
Patient's daughter called and stated that the [facility] mouthwash was [facility] and they do not mix it. Needs to be sent to [facility]. Resent as requested

## 2014-05-14 NOTE — Telephone Encounter (Signed)
Spoke with daughter about RX- request will be sent to [facility].

## 2014-06-02 ENCOUNTER — Encounter (HOSPITAL_BASED_OUTPATIENT_CLINIC_OR_DEPARTMENT_OTHER): Payer: Self-pay | Admitting: *Deleted

## 2014-06-02 ENCOUNTER — Emergency Department (HOSPITAL_BASED_OUTPATIENT_CLINIC_OR_DEPARTMENT_OTHER): Payer: Medicare Other

## 2014-06-02 ENCOUNTER — Emergency Department (HOSPITAL_BASED_OUTPATIENT_CLINIC_OR_DEPARTMENT_OTHER)
Admission: EM | Admit: 2014-06-02 | Discharge: 2014-06-03 | Disposition: A | Discharge status: Home / Self Care | Payer: Medicare Other | Attending: Emergency Medicine | Admitting: Emergency Medicine

## 2014-06-02 DIAGNOSIS — Z8639 Personal history of other endocrine, nutritional and metabolic disease: Secondary | ICD-10-CM | POA: Insufficient documentation

## 2014-06-02 DIAGNOSIS — Z79899 Other long term (current) drug therapy: Secondary | ICD-10-CM | POA: Insufficient documentation

## 2014-06-02 DIAGNOSIS — Z87828 Personal history of other (healed) physical injury and trauma: Secondary | ICD-10-CM | POA: Diagnosis not present

## 2014-06-02 DIAGNOSIS — I10 Essential (primary) hypertension: Secondary | ICD-10-CM | POA: Insufficient documentation

## 2014-06-02 DIAGNOSIS — M7122 Synovial cyst of popliteal space [Baker], left knee: Secondary | ICD-10-CM | POA: Diagnosis not present

## 2014-06-02 DIAGNOSIS — M79605 Pain in left leg: Secondary | ICD-10-CM | POA: Diagnosis present

## 2014-06-02 DIAGNOSIS — Z87891 Personal history of nicotine dependence: Secondary | ICD-10-CM | POA: Insufficient documentation

## 2014-06-02 DIAGNOSIS — Z85028 Personal history of other malignant neoplasm of stomach: Secondary | ICD-10-CM | POA: Diagnosis not present

## 2014-06-02 NOTE — ED Notes (Signed)
Pain, swelling and bruising to her left lower leg for a couple of days. Hx of DVT 20 years ago in same leg.

## 2014-06-02 NOTE — ED Notes (Signed)
Pt c/o bruising, pain and swelling to left lower ext x months  Worse last few days,  Hx of dvt several years ago and has had problems w this leg since

## 2014-06-03 NOTE — Discharge Instructions (Signed)
Baker Cyst °A Baker cyst is a sac-like structure that forms in the back of the knee. It is filled with the same fluid that is located in your knee. This fluid lubricates the bones and cartilage of the knee and allows them to move over each other more easily. °CAUSES  °When the knee becomes injured or inflamed, increased fluid forms in the knee. When this happens, the joint lining is pushed out behind the knee and forms the Baker cyst. This cyst may also be caused by inflammation from arthritic conditions and infections. °SIGNS AND SYMPTOMS  °A Baker cyst usually has no symptoms. When the cyst is substantially enlarged: °· You may feel pressure behind the knee, stiffness in the knee, or a mass in the area behind the knee. °· You may develop pain, redness, and swelling in the calf.  This can suggest a blood clot and requires evaluation by your health care provider. °DIAGNOSIS  °A Baker cyst is most often found during an ultrasound exam. This exam may have been performed for other reasons, and the cyst was found incidentally. Sometimes an MRI is used. This picks up other problems within a joint that an ultrasound exam may not. If the Baker cyst developed immediately after an injury, X-ray exams may be used to diagnose the cyst. °TREATMENT  °The treatment depends on the cause of the cyst. Anti-inflammatory medicines and rest often will be prescribed. If the cyst is caused by a bacterial infection, antibiotic medicines may be prescribed.  °HOME CARE INSTRUCTIONS  °· If the cyst was caused by an injury, for the first 24 hours, keep the injured leg elevated on 2 pillows while lying down. °· For the first 24 hours while you are awake, apply ice to the injured area: °¨ Put ice in a plastic bag. °¨ Place a towel between your skin and the bag. °¨ Leave the ice on for 20 minutes, 2-3 times a day. °· Only take over-the-counter or prescription medicines for pain, discomfort, or fever as directed by your health care  provider. °· Only take antibiotic medicine as directed. Make sure to finish it even if you start to feel better. °MAKE SURE YOU:  °· Understand these instructions. °· Will watch your condition. °· Will get help right away if you are not doing well or get worse. °Document Released: 01/23/2005 Document Revised: 11/13/2012 Document Reviewed: 09/04/2012 °ExitCare® Patient Information ©2015 [employer]. This information is not intended to replace advice given to you by your health care provider. Make sure you discuss any questions you have with your health care provider. ° °

## 2014-06-03 NOTE — ED Provider Notes (Signed)
CSN: [id number]     Arrival date & time 06/02/14  2204 History   First MD Initiated Contact with Patient 06/02/14 2245     Chief Complaint  Patient presents with  . Leg Pain     (Consider location/radiation/quality/duration/timing/severity/associated sxs/prior Treatment) HPI  This is an 81 year old female with a remote history of DVT of the left lower extremity. She is here with pain and swelling of her left leg for the past several days that acutely worsened tonight. The pain is moderate to severe especially with ambulation. She denies chest pain or shortness of breath. She has not taken anything for the pain.   Past Medical History  Diagnosis Date  . Cancer     Stomach & Colon  . HYPERLIPIDEMIA   . OPEN WOUND FINGER WITHOUT MENTION COMPLICATION   . Sinus tachycardia   . PAC (premature atrial contraction)   . Nausea & vomiting   . Arthritis   . Hypertension   . Phlebitis    Past Surgical History  Procedure Laterality Date  . Abdominal hysterectomy  1973  . Colon resection  1999  . Stomach surgery  1985  . Kidney surgery  1970s    Detached  . Lung surgery  2011    Punctured  . Appendectomy  1939  . Multiple tooth extractions     Family History  Problem Relation Age of Onset  . Ovarian cancer Mother 80    Deceased  . Lung cancer Mother   . Asthma Mother   . Heart disease Daughter   . Emphysema Daughter   . Brain cancer Daughter   . Stroke Son   . Heart disease Sister    History  Substance Use Topics  . Smoking status: Former Research scientist (life sciences)  . Smokeless tobacco: Not on file     Comment: Greater >30 years  . Alcohol Use: No   OB History    No data available     Review of Systems  All other systems reviewed and are negative.   Allergies  Tramadol  Home Medications   Prior to Admission medications   Medication Sig Start Date End Date Taking? Authorizing Provider  Alum & Mag Hydroxide-Simeth (MAGIC MOUTHWASH) SOLN Take 5 mLs by mouth 3 (three) times daily as  needed for mouth pain. Swish and spit 05/14/14   Brunetta Jeans, PA-C  amLODipine (NORVASC) 2.5 MG tablet Take 1 tablet (2.5 mg total) by mouth daily. 04/01/14   Brunetta Jeans, PA-C  aspirin-sod bicarb-citric acid (ALKA-SELTZER) 325 MG TBEF Take 325 mg by mouth daily as needed. For heartburn/indigestion    Historical Provider, MD  metoprolol succinate (TOPROL-XL) 50 MG 24 hr tablet Take 1 tablet (50 mg total) by mouth daily. 12/11/13   Lelon Perla, MD  Naproxen Sod-Diphenhydramine 220-25 MG TABS Take by mouth at bedtime as needed.    Historical Provider, MD   BP 182/51 mmHg  Pulse 74  Temp(Src) 98 F (36.7 C) (Oral)  Resp 20  Ht 5\' 2"  (1.575 m)  Wt 107 lb (48.535 kg)  BMI 19.57 kg/m2  SpO2 97%   Physical Exam  General: Well-developed, well-nourished female in no acute distress; appearance consistent with age of record HENT: normocephalic; atraumatic Eyes: pupils equal, round and reactive to light; extraocular muscles intact; arcus senilis bilaterally; lens implants Neck: supple Heart: regular rate and rhythm; 2/6 systolic murmur at the right upper sternal border Lungs: clear to auscultation bilaterally Abdomen: soft; nondistended; nontender; bowel sounds present Extremities: No deformity;  full range of motion; pulses normal; trace edema of lower legs, left greater than right; tenderness of left popliteal fossa Neurologic: Awake, alert; motor function intact in all extremities and symmetric; no facial droop; hard of hearing Skin: Warm and dry Psychiatric: Normal mood and affect    ED Course  Procedures (including critical care time)   MDM  Nursing notes and vitals signs, including pulse oximetry, reviewed.  Summary of this visit's results, reviewed by myself:  Labs:  No results found for this or any previous visit (from the past 24 hour(s)).  Imaging Studies: US Venous Img Lower Unilateral Left  06/02/2014   CLINICAL DATA:  81 year old female with 6 month history of  left lower extremity pain and swelling which has recently increased.  EXAM: LEFT LOWER EXTREMITY VENOUS DOPPLER ULTRASOUND  TECHNIQUE: Gray-scale sonography with graded compression, as well as color Doppler and duplex ultrasound were performed to evaluate the lower extremity deep venous systems from the level of the common femoral vein and including the common femoral, femoral, profunda femoral, popliteal and calf veins including the posterior tibial, peroneal and gastrocnemius veins when visible. The superficial great saphenous vein was also interrogated. Spectral Doppler was utilized to evaluate flow at rest and with distal augmentation maneuvers in the common femoral, femoral and popliteal veins.  COMPARISON:  None.  FINDINGS: Contralateral Common Femoral Vein: Respiratory phasicity is normal and symmetric with the symptomatic side. No evidence of thrombus. Normal compressibility.  Common Femoral Vein: No evidence of thrombus. Normal compressibility, respiratory phasicity and response to augmentation.  Saphenofemoral Junction: No evidence of thrombus. Normal compressibility and flow on color Doppler imaging.  Profunda Femoral Vein: No evidence of thrombus. Normal compressibility and flow on color Doppler imaging.  Femoral Vein: No evidence of thrombus. Normal compressibility, respiratory phasicity and response to augmentation.  Popliteal Vein: No evidence of thrombus. Normal compressibility, respiratory phasicity and response to augmentation.  Calf Veins: No evidence of thrombus. Normal compressibility and flow on color Doppler imaging.  Superficial Great Saphenous Vein: No evidence of thrombus. Normal compressibility and flow on color Doppler imaging.  Venous Reflux:  None.  Other Findings: Fluid collection in the popliteal fossa measures 2.4 x 1.3 x 1.8 cm and is consistent with a Baker's cyst.  IMPRESSION: 1. Negative for evidence of deep venous thrombosis. 2. Baker's cyst in the popliteal fossa.    Electronically Signed   By: Jacqulynn Cadet M.D.   On: 06/02/2014 23:52        Shanon Rosser, MD 06/03/14 [phone]

## 2014-07-14 ENCOUNTER — Encounter: Payer: Self-pay | Admitting: Physician Assistant

## 2014-07-14 ENCOUNTER — Ambulatory Visit (INDEPENDENT_AMBULATORY_CARE_PROVIDER_SITE_OTHER): Payer: Medicare Other | Admitting: Physician Assistant

## 2014-07-14 ENCOUNTER — Ambulatory Visit (HOSPITAL_BASED_OUTPATIENT_CLINIC_OR_DEPARTMENT_OTHER)
Admission: RE | Admit: 2014-07-14 | Discharge: 2014-07-14 | Disposition: A | Discharge status: Home / Self Care | Payer: Medicare Other | Source: Ambulatory Visit | Attending: Physician Assistant | Admitting: Physician Assistant

## 2014-07-14 VITALS — BP 160/59 | HR 63 | Resp 16 | Ht 62.0 in | Wt 106.1 lb

## 2014-07-14 DIAGNOSIS — M79632 Pain in left forearm: Secondary | ICD-10-CM | POA: Diagnosis not present

## 2014-07-14 DIAGNOSIS — M79602 Pain in left arm: Secondary | ICD-10-CM | POA: Diagnosis not present

## 2014-07-14 MED ORDER — NAPROXEN 250 MG PO TABS: 250.0000 mg | ORAL_TABLET | Freq: Two times a day (BID) | ORAL | Status: DC

## 2014-07-14 NOTE — Progress Notes (Signed)
Patient presents to clinic today c/o throbbing pain in left upper arm and forearm x 1 months.  Denies trauma or injury.  Denies swelling, numbness, bruising or tingling.  Endorses pain with supination.  Past Medical History  Diagnosis Date  . Cancer     Stomach & Colon  . HYPERLIPIDEMIA   . OPEN WOUND FINGER WITHOUT MENTION COMPLICATION   . Sinus tachycardia   . PAC (premature atrial contraction)   . Nausea & vomiting   . Arthritis   . Hypertension   . Phlebitis     Current Outpatient Prescriptions on File Prior to Visit  Medication Sig Dispense Refill  . Alum & Mag Hydroxide-Simeth (MAGIC MOUTHWASH) SOLN Take 5 mLs by mouth 3 (three) times daily as needed for mouth pain. Swish and spit 10 mL 0  . amLODipine (NORVASC) 2.5 MG tablet Take 1 tablet (2.5 mg total) by mouth daily. (Patient taking differently: Take 2.5 mg by mouth daily as needed. ) 30 tablet 1  . aspirin-sod bicarb-citric acid (ALKA-SELTZER) 325 MG TBEF Take 325 mg by mouth daily as needed. For heartburn/indigestion    . metoprolol succinate (TOPROL-XL) 50 MG 24 hr tablet Take 1 tablet (50 mg total) by mouth daily. 30 tablet 6   No current facility-administered medications on file prior to visit.    Allergies  Allergen Reactions  . Tramadol Nausea And Vomiting    Family History  Problem Relation Age of Onset  . Ovarian cancer Mother 37    Deceased  . Lung cancer Mother   . Asthma Mother   . Heart disease Daughter   . Emphysema Daughter   . Brain cancer Daughter   . Stroke Son   . Heart disease Sister     History   Social History  . Marital Status: Divorced    Spouse Name: N/A  . Number of Children: N/A  . Years of Education: N/A   Social History Main Topics  . Smoking status: Former Research scientist (life sciences)  . Smokeless tobacco: Not on file     Comment: Greater >30 years  . Alcohol Use: No  . Drug Use: No  . Sexual Activity: Not on file   Other Topics Concern  . None   Social History Narrative   Review  of Systems - See HPI.  All other ROS are negative.  BP 160/59 mmHg  Pulse 63  Resp 16  Ht 5\' 2"  (1.575 m)  Wt 106 lb 2 oz (48.138 kg)  BMI 19.41 kg/m2  SpO2 100%  Physical Exam  Constitutional: She is oriented to person, place, and time and well-developed, well-nourished, and in no distress.  HENT:  Head: Normocephalic and atraumatic.  Eyes: Conjunctivae are normal. Pupils are equal, round, and reactive to light.  Neck: Neck supple.  Cardiovascular: Normal rate, regular rhythm, normal heart sounds and intact distal pulses.   Pulmonary/Chest: Effort normal and breath sounds normal. No respiratory distress. She has no wheezes. She has no rales. She exhibits no tenderness.  Musculoskeletal:       Left elbow: Normal.       Left wrist: Normal.       Left upper arm: She exhibits tenderness. She exhibits no bony tenderness.       Left forearm: She exhibits tenderness. She exhibits no bony tenderness.  Lymphadenopathy:    She has no cervical adenopathy.  Neurological: She is alert and oriented to person, place, and time.  Skin: Skin is warm and dry. No rash noted.  Psychiatric:  Affect normal.  Vitals reviewed.  Assessment/Plan: Left arm pain Suspect tendinopathy and muscle strain.  Will obtain x-ray giving chronicity and patient concerns. Rx naproxen 250 mg twice daily with food for the next week. Tylenol for breakthrough pain.  RICE therapy reviewed. Follow-up 1 week.

## 2014-07-14 NOTE — Patient Instructions (Signed)
Please go downstairs for x-ray.  I will call you with your results. Take Naproxen as directed with food. Use Tylenol if needed for breakthrough pain. You can apply a Salon Pas patch to the area to relieve pain. Avoid heavy lifting or overexertion.   Follow-up 1-2 weeks.

## 2014-07-14 NOTE — Progress Notes (Signed)
Pre visit review using our clinic review tool, if applicable. No additional management support is needed unless otherwise documented below in the visit note/SLS  

## 2014-07-14 NOTE — Assessment & Plan Note (Signed)
Suspect tendinopathy and muscle strain.  Will obtain x-ray giving chronicity and patient concerns. Rx naproxen 250 mg twice daily with food for the next week. Tylenol for breakthrough pain.  RICE therapy reviewed. Follow-up 1 week.

## 2014-07-17 ENCOUNTER — Telehealth: Payer: Self-pay | Admitting: Physician Assistant

## 2014-07-17 NOTE — Telephone Encounter (Signed)
Caller name: Megan Mans Relationship to patient: daughter Can be reached: [phone] Pharmacy:  Reason for call: Returning your call.

## 2014-07-17 NOTE — Telephone Encounter (Signed)
SEE Result note. 

## 2014-07-28 ENCOUNTER — Encounter: Payer: Self-pay | Admitting: Physician Assistant

## 2014-07-28 ENCOUNTER — Ambulatory Visit (INDEPENDENT_AMBULATORY_CARE_PROVIDER_SITE_OTHER): Payer: Medicare Other | Admitting: Physician Assistant

## 2014-07-28 VITALS — BP 124/52 | HR 64 | Resp 14 | Ht 62.0 in | Wt 107.1 lb

## 2014-07-28 DIAGNOSIS — M79602 Pain in left arm: Secondary | ICD-10-CM

## 2014-07-28 MED ORDER — HYDROCODONE-ACETAMINOPHEN 5-325 MG PO TABS: 0.5000 | ORAL_TABLET | Freq: Four times a day (QID) | ORAL | Status: DC | PRN

## 2014-07-28 NOTE — Progress Notes (Signed)
Patient presents to clinic today for follow-up of left forearm pain that is chronic in nature.  X-rays obtained and negative for fracture, injury or arthritic changes. Trial of Naproxen started.  Patient denies improvement in symptoms with medication.  Denies numbness, tingling or decreased ROM.  Denies new symptoms.  Past Medical History  Diagnosis Date  . Cancer     Stomach & Colon  . HYPERLIPIDEMIA   . OPEN WOUND FINGER WITHOUT MENTION COMPLICATION   . Sinus tachycardia   . PAC (premature atrial contraction)   . Nausea & vomiting   . Arthritis   . Hypertension   . Phlebitis     Current Outpatient Prescriptions on File Prior to Visit  Medication Sig Dispense Refill  . acetaminophen (TYLENOL) 500 MG tablet Take 1,000 mg by mouth at bedtime.    . Alum & Mag Hydroxide-Simeth (MAGIC MOUTHWASH) SOLN Take 5 mLs by mouth 3 (three) times daily as needed for mouth pain. Swish and spit 10 mL 0  . amLODipine (NORVASC) 2.5 MG tablet Take 1 tablet (2.5 mg total) by mouth daily. (Patient taking differently: Take 2.5 mg by mouth daily as needed. ) 30 tablet 1  . aspirin-sod bicarb-citric acid (ALKA-SELTZER) 325 MG TBEF Take 325 mg by mouth daily as needed. For heartburn/indigestion    . metoprolol succinate (TOPROL-XL) 50 MG 24 hr tablet Take 1 tablet (50 mg total) by mouth daily. 30 tablet 6   No current facility-administered medications on file prior to visit.    Allergies  Allergen Reactions  . Tramadol Nausea And Vomiting    Family History  Problem Relation Age of Onset  . Ovarian cancer Mother 40    Deceased  . Lung cancer Mother   . Asthma Mother   . Heart disease Daughter   . Emphysema Daughter   . Brain cancer Daughter   . Stroke Son   . Heart disease Sister     History   Social History  . Marital Status: Divorced    Spouse Name: N/A  . Number of Children: N/A  . Years of Education: N/A   Social History Main Topics  . Smoking status: Former Research scientist (life sciences)  . Smokeless  tobacco: Not on file     Comment: Greater >30 years  . Alcohol Use: No  . Drug Use: No  . Sexual Activity: Not on file   Other Topics Concern  . None   Social History Narrative   Review of Systems - See HPI.  All other ROS are negative.  BP 124/52 mmHg  Pulse 64  Resp 14  Ht 5\' 2"  (1.575 m)  Wt 107 lb 2 oz (48.592 kg)  BMI 19.59 kg/m2  SpO2 100%  Physical Exam  Constitutional: She is oriented to person, place, and time and well-developed, well-nourished, and in no distress.  HENT:  Head: Normocephalic and atraumatic.  Cardiovascular: Normal rate, regular rhythm, normal heart sounds and intact distal pulses.   Pulmonary/Chest: Effort normal and breath sounds normal. No respiratory distress. She has no wheezes. She has no rales. She exhibits no tenderness.  Musculoskeletal:       Left forearm: She exhibits tenderness. She exhibits no bony tenderness, no swelling, no edema, no deformity and no laceration.  Neurological: She is alert and oriented to person, place, and time.  Skin: Skin is warm. No rash noted.  Vitals reviewed.    Assessment/Plan: Left arm pain X-ray unremarkable.  Suspect muscular etiology.  Rx Norco to take 1/2 tablet as  needed for severe pain.  Referral placed to Sports Medicine for further assessment.

## 2014-07-28 NOTE — Patient Instructions (Signed)
Please take the Norco as directed when needed for severe pain.  Continue topical creams. Avoid heavy lifting. You will be contacted for assessment by Sports Medicine.

## 2014-07-28 NOTE — Assessment & Plan Note (Signed)
X-ray unremarkable.  Suspect muscular etiology.  Rx Norco to take 1/2 tablet as needed for severe pain.  Referral placed to Sports Medicine for further assessment.

## 2014-07-30 ENCOUNTER — Telehealth: Payer: Self-pay | Admitting: Physician Assistant

## 2014-07-30 ENCOUNTER — Ambulatory Visit: Payer: Medicare Other | Admitting: Family Medicine

## 2014-07-30 NOTE — Telephone Encounter (Signed)
Caller name: Robinson,Trish Relation to pt: daughter Call back number: [phone]    Reason for call:  Daughter checking on the status of HYDROcodone-acetaminophen (NORCO/VICODIN) 5-325 MG per tablet. Pt was last seen 07/28/2014

## 2014-07-30 NOTE — Telephone Encounter (Signed)
Left message informing Amber [name] of this.

## 2014-07-30 NOTE — Telephone Encounter (Signed)
error:[id number] ° °

## 2014-07-30 NOTE — Telephone Encounter (Signed)
Was written at time of her appointment.  Rx was printed and given to her.  Have her check her papers from her OV

## 2014-07-30 NOTE — Telephone Encounter (Signed)
Caller name: Wannetta Sender Relation to pt: daughter Call back number: [phone] Pharmacy:  Reason for call:   States that patient needs hydrocodone rx. Looks like it has been approved 07/28/14 but rx is not up front nor has anyone picked up rx. Please advise.

## 2014-07-30 NOTE — Telephone Encounter (Signed)
Requesting Hydrocodone-acetaminophen 5-325mg -Take 0.5mg  tablet by mouth every 6 hours as needed for moderate pain. Last refill:07-28-14;#30,0 Last OV:07-28-14 No UDS Please advise.//AB/CMA

## 2014-07-30 NOTE — Telephone Encounter (Signed)
See phone note -- there was one about this earlier. Again, patient was given the prescription at visit. I had asked staff to call her and see if he had it with her AVS paperwork.  If she misplaced it, I can re-write and she can pick it up tomorrow.

## 2014-07-31 MED ORDER — HYDROCODONE-ACETAMINOPHEN 5-325 MG PO TABS: 0.5000 | ORAL_TABLET | Freq: Four times a day (QID) | ORAL | Status: DC | PRN

## 2014-07-31 NOTE — Telephone Encounter (Signed)
Called and spoke with the pt's daughter and informed her that the pt was given the Rx at the time of the visit.  Daughter stated that the Rx was not given at the visit.  Informed the daughter that we can re-write the rx but she will have to pick it up.  Daughter agreed.  New Rx printed and placed up front for the daughter to pick up.//AB/CMA

## 2014-08-04 ENCOUNTER — Ambulatory Visit (INDEPENDENT_AMBULATORY_CARE_PROVIDER_SITE_OTHER): Payer: Medicare Other | Admitting: Family Medicine

## 2014-08-04 ENCOUNTER — Encounter: Payer: Self-pay | Admitting: Family Medicine

## 2014-08-04 VITALS — BP 151/72 | HR 77 | Ht 64.0 in | Wt 103.0 lb

## 2014-08-04 DIAGNOSIS — M79602 Pain in left arm: Secondary | ICD-10-CM

## 2014-08-04 MED ORDER — PREDNISONE 10 MG PO TABS: ORAL_TABLET | ORAL | Status: DC

## 2014-08-04 NOTE — Assessment & Plan Note (Signed)
exam normal, benign.  Radiographs also normal.  Feels better with compression, rubbing the area.  This along with level of pain usually consistent with a neuropathy.  Advised we try ace wrap and prednisone dose pack.  Ice or heat if needed.  Call us in 1-2 weeks to let us know how she's doing.  If still not improving would consider an MRI to look for occult bony lesion not seen on radiographs.

## 2014-08-04 NOTE — Progress Notes (Signed)
PCP and referred by: Leeanne Rio, PA-C  Subjective:   HPI: Patient is a 81 y.o. female here for left arm pain.  Patient denies known injury or trauma. She is unsure when left forearm pain started - believes at least a month if not more. Pain from elbow into forearm. Feels better to rub the area. No numbness or tingling. Not worse with any particular motions. Right handed. Radiographs negative. Tried norco, naproxen.  Past Medical History  Diagnosis Date  . Cancer     Stomach & Colon  . HYPERLIPIDEMIA   . OPEN WOUND FINGER WITHOUT MENTION COMPLICATION   . Sinus tachycardia   . PAC (premature atrial contraction)   . Nausea & vomiting   . Arthritis   . Hypertension   . Phlebitis     Current Outpatient Prescriptions on File Prior to Visit  Medication Sig Dispense Refill  . acetaminophen (TYLENOL) 500 MG tablet Take 1,000 mg by mouth at bedtime.    . Alum & Mag Hydroxide-Simeth (MAGIC MOUTHWASH) SOLN Take 5 mLs by mouth 3 (three) times daily as needed for mouth pain. Swish and spit 10 mL 0  . amLODipine (NORVASC) 2.5 MG tablet Take 1 tablet (2.5 mg total) by mouth daily. (Patient taking differently: Take 2.5 mg by mouth daily as needed. ) 30 tablet 1  . aspirin-sod bicarb-citric acid (ALKA-SELTZER) 325 MG TBEF Take 325 mg by mouth daily as needed. For heartburn/indigestion    . HYDROcodone-acetaminophen (NORCO/VICODIN) 5-325 MG per tablet Take 0.5 tablets by mouth every 6 (six) hours as needed for moderate pain. 30 tablet 0  . metoprolol succinate (TOPROL-XL) 50 MG 24 hr tablet Take 1 tablet (50 mg total) by mouth daily. 30 tablet 6   No current facility-administered medications on file prior to visit.    Past Surgical History  Procedure Laterality Date  . Abdominal hysterectomy  1973  . Colon resection  1999  . Stomach surgery  1985  . Kidney surgery  1970s    Detached  . Lung surgery  2011    Punctured  . Appendectomy  1939  . Multiple tooth extractions       Allergies  Allergen Reactions  . Tramadol Nausea And Vomiting    History   Social History  . Marital Status: Divorced    Spouse Name: N/A  . Number of Children: N/A  . Years of Education: N/A   Occupational History  . Not on file.   Social History Main Topics  . Smoking status: Former Research scientist (life sciences)  . Smokeless tobacco: Not on file     Comment: Greater >30 years  . Alcohol Use: No  . Drug Use: No  . Sexual Activity: Not on file   Other Topics Concern  . Not on file   Social History Narrative    Family History  Problem Relation Age of Onset  . Ovarian cancer Mother 10    Deceased  . Lung cancer Mother   . Asthma Mother   . Heart disease Daughter   . Emphysema Daughter   . Brain cancer Daughter   . Stroke Son   . Heart disease Sister     BP 151/72 mmHg  Pulse 77  Ht 5\' 4"  (1.626 m)  Wt 103 lb (46.72 kg)  BMI 17.67 kg/m2  Review of Systems: See HPI above.    Objective:  Physical Exam:  Gen: NAD  Left arm/forearm: No gross deformity, swelling, bruising. No tenderness to palpation. FROM elbow and wrist without pain.  5/5 strength all motions of digits, wrist, elbow and no reproduction of pain. Collateral ligaments intact of elbow. NVI distally.    Assessment & Plan:  1. Left arm pain - exam normal, benign.  Radiographs also normal.  Feels better with compression, rubbing the area.  This along with level of pain usually consistent with a neuropathy.  Advised we try ace wrap and prednisone dose pack.  Ice or heat if needed.  Call us in 1-2 weeks to let us know how she's doing.  If still not improving would consider an MRI to look for occult bony lesion not seen on radiographs.

## 2014-08-04 NOTE — Patient Instructions (Signed)
Your exam is normal. I would treat this as a severe nerve irritation (neuropathy) as these can be this painful and have a normal exam. Try prednisone dose pack x 6 days. ACE wrap for compression. Ice or heat (whichever feels better) 15 minutes at a time 3-4 times a day. If 1-2 weeks from now you're not improving call us and we can consider an MRI.

## 2014-08-11 ENCOUNTER — Other Ambulatory Visit: Payer: Self-pay | Admitting: Physician Assistant

## 2014-08-14 DIAGNOSIS — M25562 Pain in left knee: Secondary | ICD-10-CM | POA: Diagnosis not present

## 2014-08-14 DIAGNOSIS — M2392 Unspecified internal derangement of left knee: Secondary | ICD-10-CM | POA: Diagnosis not present

## 2014-08-14 DIAGNOSIS — M1712 Unilateral primary osteoarthritis, left knee: Secondary | ICD-10-CM | POA: Diagnosis not present

## 2014-08-14 DIAGNOSIS — M84452D Pathological fracture, left femur, subsequent encounter for fracture with routine healing: Secondary | ICD-10-CM | POA: Diagnosis not present

## 2014-10-02 ENCOUNTER — Telehealth: Payer: Self-pay | Admitting: Physician Assistant

## 2014-10-02 MED ORDER — HYDROCODONE-ACETAMINOPHEN 5-325 MG PO TABS: 0.5000 | ORAL_TABLET | Freq: Four times a day (QID) | ORAL | Status: AC | PRN

## 2014-10-02 NOTE — Telephone Encounter (Signed)
Medication Detail      Disp Refills Start End     HYDROcodone-acetaminophen (NORCO/VICODIN) 5-325 MG per tablet 30 tablet 0 07/31/2014     Sig - Route: Take 0.5 tablets by mouth every 6 (six) hours as needed for moderate pain. - Oral    Class: Print     Pharmacy    [facility], [place]   Please Advise on refills/SLS

## 2014-10-02 NOTE — Telephone Encounter (Signed)
Rx printed and at front desk.  Will need urine sample for UDS at pickup.

## 2014-10-02 NOTE — Telephone Encounter (Signed)
Caller name:Robinson,Trish Relation to pt: daughter  Call back number: home # [phone] and mobile daughter is a Pharmacist, hospital [phone] Pharmacy:  Reason for call:  Requesting a refill   HYDROcodone-acetaminophen (NORCO/VICODIN) 5-325 MG per tablet

## 2014-10-06 ENCOUNTER — Other Ambulatory Visit: Payer: Self-pay | Admitting: Physician Assistant

## 2014-10-20 DIAGNOSIS — M1712 Unilateral primary osteoarthritis, left knee: Secondary | ICD-10-CM | POA: Diagnosis not present

## 2014-10-27 DIAGNOSIS — M25562 Pain in left knee: Secondary | ICD-10-CM | POA: Diagnosis not present

## 2014-10-27 DIAGNOSIS — M1712 Unilateral primary osteoarthritis, left knee: Secondary | ICD-10-CM | POA: Diagnosis not present

## 2014-11-03 DIAGNOSIS — M25562 Pain in left knee: Secondary | ICD-10-CM | POA: Diagnosis not present

## 2014-11-03 DIAGNOSIS — M2392 Unspecified internal derangement of left knee: Secondary | ICD-10-CM | POA: Diagnosis not present

## 2014-11-03 DIAGNOSIS — M1712 Unilateral primary osteoarthritis, left knee: Secondary | ICD-10-CM | POA: Diagnosis not present

## 2014-12-07 ENCOUNTER — Telehealth: Payer: Self-pay

## 2014-12-07 NOTE — Telephone Encounter (Signed)
Called patient to schedule her for her annual wellness visit.

## 2014-12-12 ENCOUNTER — Other Ambulatory Visit: Payer: Self-pay | Admitting: Physician Assistant

## 2014-12-14 ENCOUNTER — Other Ambulatory Visit: Payer: Self-pay | Admitting: *Deleted

## 2014-12-14 DIAGNOSIS — R000 Tachycardia, unspecified: Secondary | ICD-10-CM

## 2014-12-14 MED ORDER — METOPROLOL SUCCINATE ER 50 MG PO TB24: 50.0000 mg | ORAL_TABLET | Freq: Every day | ORAL | Status: AC

## 2014-12-19 DIAGNOSIS — M5441 Lumbago with sciatica, right side: Secondary | ICD-10-CM | POA: Diagnosis not present

## 2014-12-19 DIAGNOSIS — M6281 Muscle weakness (generalized): Secondary | ICD-10-CM | POA: Diagnosis not present

## 2014-12-19 DIAGNOSIS — M4156 Other secondary scoliosis, lumbar region: Secondary | ICD-10-CM | POA: Diagnosis not present

## 2014-12-19 DIAGNOSIS — M5136 Other intervertebral disc degeneration, lumbar region: Secondary | ICD-10-CM | POA: Diagnosis not present

## 2015-01-14 ENCOUNTER — Other Ambulatory Visit: Payer: Self-pay | Admitting: Physician Assistant

## 2015-01-21 DIAGNOSIS — M5136 Other intervertebral disc degeneration, lumbar region: Secondary | ICD-10-CM | POA: Diagnosis not present

## 2015-01-21 DIAGNOSIS — M4156 Other secondary scoliosis, lumbar region: Secondary | ICD-10-CM | POA: Diagnosis not present

## 2015-01-21 DIAGNOSIS — M84452D Pathological fracture, left femur, subsequent encounter for fracture with routine healing: Secondary | ICD-10-CM | POA: Diagnosis not present

## 2015-01-21 DIAGNOSIS — M1712 Unilateral primary osteoarthritis, left knee: Secondary | ICD-10-CM | POA: Diagnosis not present

## 2015-02-02 ENCOUNTER — Telehealth: Payer: Self-pay | Admitting: Physician Assistant

## 2015-02-02 NOTE — Telephone Encounter (Signed)
Pt never picked up rx for Hydrocodone (Norco/Vicodin) from 11-02-2014. (shredded)

## 2015-02-06 ENCOUNTER — Emergency Department (HOSPITAL_BASED_OUTPATIENT_CLINIC_OR_DEPARTMENT_OTHER)
Admission: EM | Admit: 2015-02-06 | Discharge: 2015-02-06 | Disposition: A | Discharge status: Home / Self Care | Payer: Medicare Other | Attending: Emergency Medicine | Admitting: Emergency Medicine

## 2015-02-06 ENCOUNTER — Encounter (HOSPITAL_BASED_OUTPATIENT_CLINIC_OR_DEPARTMENT_OTHER): Payer: Self-pay | Admitting: *Deleted

## 2015-02-06 ENCOUNTER — Emergency Department (HOSPITAL_BASED_OUTPATIENT_CLINIC_OR_DEPARTMENT_OTHER): Payer: Medicare Other

## 2015-02-06 DIAGNOSIS — I10 Essential (primary) hypertension: Secondary | ICD-10-CM | POA: Insufficient documentation

## 2015-02-06 DIAGNOSIS — Z8639 Personal history of other endocrine, nutritional and metabolic disease: Secondary | ICD-10-CM | POA: Diagnosis not present

## 2015-02-06 DIAGNOSIS — K0889 Other specified disorders of teeth and supporting structures: Secondary | ICD-10-CM | POA: Diagnosis not present

## 2015-02-06 DIAGNOSIS — M1990 Unspecified osteoarthritis, unspecified site: Secondary | ICD-10-CM | POA: Diagnosis not present

## 2015-02-06 DIAGNOSIS — Z79899 Other long term (current) drug therapy: Secondary | ICD-10-CM | POA: Insufficient documentation

## 2015-02-06 DIAGNOSIS — K1379 Other lesions of oral mucosa: Secondary | ICD-10-CM | POA: Diagnosis not present

## 2015-02-06 DIAGNOSIS — K002 Abnormalities of size and form of teeth: Secondary | ICD-10-CM | POA: Insufficient documentation

## 2015-02-06 DIAGNOSIS — Z85038 Personal history of other malignant neoplasm of large intestine: Secondary | ICD-10-CM | POA: Insufficient documentation

## 2015-02-06 DIAGNOSIS — K068 Other specified disorders of gingiva and edentulous alveolar ridge: Secondary | ICD-10-CM

## 2015-02-06 MED ORDER — KETOROLAC TROMETHAMINE 30 MG/ML IJ SOLN
60.0000 mg | Freq: Once | INTRAMUSCULAR | Status: AC
  Administered 2015-02-06: 60 mg via INTRAMUSCULAR
  Filled 2015-02-06: qty 2

## 2015-02-06 NOTE — ED Provider Notes (Signed)
CSN: [id number]     Arrival date & time 02/06/15  1128 History   First MD Initiated Contact with Patient 02/06/15 1208     Chief Complaint  Patient presents with  . Dental Pain   HPI  Amber [name] is an 81 year old female with PMHx of stomach cancer, colon cancer, HTN and arthritis presenting with gum pain. She states that the pain has been present for multiple months. The pain is located in the right lower gum. She does not have any teeth. She denies wearing dentures. She denies injury to the gum. The pain is alleviated by holding pressure to her gum. She has tried orajel and advil without relief of her pain. She does not have a dentist. Denies fevers, chills, headaches, dizziness, syncope, facial swelling, redness of the face or gums, bleeding from the gums, difficulty swallowing, difficulty breathing, neck pain, chest pain, SOB or cough. She has not seen her PCP for these complaints.   Past Medical History  Diagnosis Date  . Cancer [facility])     Stomach & Colon  . HYPERLIPIDEMIA   . OPEN WOUND FINGER WITHOUT MENTION COMPLICATION   . Sinus tachycardia ([place])   . PAC (premature atrial contraction)   . Nausea & vomiting   . Arthritis   . Hypertension   . Phlebitis    Past Surgical History  Procedure Laterality Date  . Abdominal hysterectomy  1973  . Colon resection  1999  . Stomach surgery  1985  . Kidney surgery  1970s    Detached  . Lung surgery  2011    Punctured  . Appendectomy  1939  . Multiple tooth extractions     Family History  Problem Relation Age of Onset  . Ovarian cancer Mother 59    Deceased  . Lung cancer Mother   . Asthma Mother   . Heart disease Daughter   . Emphysema Daughter   . Brain cancer Daughter   . Stroke Son   . Heart disease Sister    Social History  Substance Use Topics  . Smoking status: Former Research scientist (life sciences)  . Smokeless tobacco: None     Comment: Greater >30 years  . Alcohol Use: No   OB History    No data available     Review of Systems  HENT:  Positive for dental problem.   All other systems reviewed and are negative.     Allergies  Tramadol  Home Medications   Prior to Admission medications   Medication Sig Start Date End Date Taking? Authorizing Provider  acetaminophen (TYLENOL) 500 MG tablet Take 1,000 mg by mouth at bedtime.    Historical Provider, MD  Alum & Mag Hydroxide-Simeth (MAGIC MOUTHWASH) SOLN Take 5 mLs by mouth 3 (three) times daily as needed for mouth pain. Swish and spit 05/14/14   Brunetta Jeans, PA-C  amLODipine (NORVASC) 2.5 MG tablet Follow-up visit needed. 01/15/15   Brunetta Jeans, PA-C  aspirin-sod bicarb-citric acid (ALKA-SELTZER) 325 MG TBEF Take 325 mg by mouth daily as needed. For heartburn/indigestion    Historical Provider, MD  HYDROcodone-acetaminophen (NORCO/VICODIN) 5-325 MG per tablet Take 0.5 tablets by mouth every 6 (six) hours as needed for moderate pain. 10/02/14   Brunetta Jeans, PA-C  metoprolol succinate (TOPROL-XL) 50 MG 24 hr tablet Take 1 tablet (50 mg total) by mouth daily. 12/14/14   Lelon Perla, MD   BP 176/51 mmHg  Pulse 65  Temp(Src) 97.8 F (36.6 C) (Oral)  Resp 18  Ht 5\' 4"  (1.626 m)  Wt 45.36 kg  BMI 17.16 kg/m2  SpO2 100% Physical Exam  Constitutional: She appears well-developed and well-nourished. No distress.  HENT:  Head: Normocephalic and atraumatic.  Nose: Nose normal. Right sinus exhibits no maxillary sinus tenderness and no frontal sinus tenderness. Left sinus exhibits no maxillary sinus tenderness and no frontal sinus tenderness.  Mouth/Throat: Uvula is midline, oropharynx is clear and moist and mucous membranes are normal. Mucous membranes are not dry. She does not have dentures. No oral lesions. No trismus in the jaw. Abnormal dentition. No dental abscesses or uvula swelling.    No teeth. Tenderness over the right lower gumline. No erythema or abscess noted. No swelling of the gums. No skin breakdown or lesions. No trismus. Uvula is midline without  swelling. No sublingual induration, erythema or tenderness.  Eyes: Conjunctivae are normal. Right eye exhibits no discharge. Left eye exhibits no discharge. No scleral icterus.  Neck: Normal range of motion. Neck supple.  Full range of motion without pain. No soft tissue swelling of the neck.  Cardiovascular: Normal rate and regular rhythm.   Pulmonary/Chest: Effort normal. No respiratory distress.  Musculoskeletal: Normal range of motion.  Lymphadenopathy:    She has no cervical adenopathy.  Neurological: She is alert. Coordination normal.  Skin: Skin is warm and dry.  Psychiatric: She has a normal mood and affect. Her behavior is normal.  Nursing note and vitals reviewed.   ED Course  Procedures (including critical care time) Labs Review Labs Reviewed - No data to display  Imaging Review No results found. I have personally reviewed and evaluated these images and lab results as part of my medical decision-making.   EKG Interpretation None      MDM   Final diagnoses:  Pain in gums   Patient presenting with gum pain x multiple months. No obvious abscess on exam. No soft tissue swelling of the cheek or neck. Exam unconcerning for Ludwig's angina or spreading infection.  Pain controlled in ED with toradol. Patient seen by Dr. Regenia Skeeter who recommends x-ray of the mandible. Patient declines x-ray and is requesting pain medication only. Patient was prescribed Vicodin by her family medicine doctor. Will discharge and encouraged pt to follow-up with dentist. Resource guide given in discharge paperwork. Return precautions discussed with pt and given in discharge paperwork. Stable for discharge.      Amber Gip, PA-C 02/06/15 [place], MD 02/07/15 [phone]

## 2015-02-06 NOTE — Discharge Instructions (Signed)
Continue using Orajel and over-the-counter pain relievers for your gum pain. Schedule an appointment with a dentist recently as possible. Schedule a follow-up appointment with your primary care provider as needed.   Dental Care and Dentist Visits Dental care supports good overall health. Regular dental visits can also help you avoid dental pain, bleeding, infection, and other more serious health problems in the future. It is important to keep the mouth healthy because diseases in the teeth, gums, and other oral tissues can spread to other areas of the body. Some problems, such as diabetes, heart disease, and pre-term labor have been associated with poor oral health.  See your dentist every 6 months. If you experience emergency problems such as a toothache or broken tooth, go to the dentist right away. If you see your dentist regularly, you may catch problems early. It is easier to be treated for problems in the early stages.  WHAT TO EXPECT AT A DENTIST VISIT  Your dentist will look for many common oral health problems and recommend proper treatment. At your regular dental visit, you can expect:  Gentle cleaning of the teeth and gums. This includes scraping and polishing. This helps to remove the sticky substance around the teeth and gums (plaque). Plaque forms in the mouth shortly after eating. Over time, plaque hardens on the teeth as tartar. If tartar is not removed regularly, it can cause problems. Cleaning also helps remove stains.  Periodic X-rays. These pictures of the teeth and supporting bone will help your dentist assess the health of your teeth.  Periodic fluoride treatments. Fluoride is a natural mineral shown to help strengthen teeth. Fluoride treatmentinvolves applying a fluoride gel or varnish to the teeth. It is most commonly done in children.  Examination of the mouth, tongue, jaws, teeth, and gums to look for any oral health problems, such as:  Cavities (dental caries). This is  decay on the tooth caused by plaque, sugar, and acid in the mouth. It is best to catch a cavity when it is small.  Inflammation of the gums caused by plaque buildup (gingivitis).  Problems with the mouth or malformed or misaligned teeth.  Oral cancer or other diseases of the soft tissues or jaws. KEEP YOUR TEETH AND GUMS HEALTHY For healthy teeth and gums, follow these general guidelines as well as your dentist's specific advice:  Have your teeth professionally cleaned at the dentist every 6 months.  Brush twice daily with a fluoride toothpaste.  Floss your teeth daily.  Ask your dentist if you need fluoride supplements, treatments, or fluoride toothpaste.  Eat a healthy diet. Reduce foods and drinks with added sugar.  Avoid smoking. TREATMENT FOR ORAL HEALTH PROBLEMS If you have oral health problems, treatment varies depending on the conditions present in your teeth and gums.  Your caregiver will most likely recommend good oral hygiene at each visit.  For cavities, gingivitis, or other oral health disease, your caregiver will perform a procedure to treat the problem. This is typically done at a separate appointment. Sometimes your caregiver will refer you to another dental specialist for specific tooth problems or for surgery. SEEK IMMEDIATE DENTAL CARE IF:  You have pain, bleeding, or soreness in the gum, tooth, jaw, or mouth area.  A permanent tooth becomes loose or separated from the gum socket.  You experience a blow or injury to the mouth or jaw area.   This information is not intended to replace advice given to you by your health care provider. Make sure  you discuss any questions you have with your health care provider.   Document Released: 10/05/2010 Document Revised: 04/17/2011 Document Reviewed: 10/05/2010 Elsevier Interactive Patient Education 2016 [employer].   Emergency Department Resource Guide 1) Find a Doctor and Pay Out of Pocket Although you won't  have to find out who is covered by your insurance plan, it is a good idea to ask around and get recommendations. You will then need to call the office and see if the doctor you have chosen will accept you as a new patient and what types of options they offer for patients who are self-pay. Some doctors offer discounts or will set up payment plans for their patients who do not have insurance, but you will need to ask so you aren't surprised when you get to your appointment.  2) Contact Your Local Health Department Not all health departments have doctors that can see patients for sick visits, but many do, so it is worth a call to see if yours does. If you don't know where your local health department is, you can check in your phone book. The CDC also has a tool to help you locate your state's health department, and many state websites also have listings of all of their local health departments.  3) Find a [facility] If your illness is not likely to be very severe or complicated, you may want to try a walk in clinic. These are popping up all over the country in pharmacies, drugstores, and shopping centers. They're usually staffed by nurse practitioners or physician assistants that have been trained to treat common illnesses and complaints. They're usually fairly quick and inexpensive. However, if you have serious medical issues or chronic medical problems, these are probably not your best option.  No Primary Care Doctor: - Call Health Connect at  [phone] - they can help you locate a primary care doctor that  accepts your insurance, provides certain services, etc. - Physician Referral Service- [phone]  Chronic Pain Problems: Organization         Address  Phone   Notes  [facility]  [phone] Patients need to be referred by their primary care doctor.   Medication Assistance: Organization         Address  Phone   Notes  [facility] Medication [facility]  [address] [place], [place] [address] [phone] --Must be a resident of [facility] -- Must have NO insurance coverage whatsoever (no Medicaid/ Medicare, etc.) -- The pt. MUST have a primary care doctor that directs their care regularly and follows them in the community   MedAssist  [phone]   [employer]  [phone]    Agencies that provide inexpensive medical care: Organization         Address  Phone   Notes  [place]  [phone]   [facility]    [phone]   [facility] [place], [place] [address] [phone]   [place] [address], [place] [phone]   Planned Parenthood    [phone]   [facility]    [phone]   [place] and [place] [address], [place] Phone:  [phone], Fax:  [phone] Hours of Operation:  9 am - 6 pm, M-F.  Also accepts Medicaid/Medicare and self-pay.  [facility] for [place]  [address], [address], [place] Phone: [phone], Fax: [phone]. Hours of Operation:  8:30 am - 5:30 pm, M-F.  Also accepts Medicaid and self-pay.  [facility] [place] [address], [place] Phone: [phone]   [place], [place], [place] [phone], Ext. [phone] Mondays & Thursdays: 7-9 AM.  First 15 patients are seen on a first come, first serve basis.    [place] Providers:  Organization         Address  Phone   Notes  [facility] [address], [place] [phone] Also accepts self-pay patients.  [facility] [id number] [place], [place]  [phone]   [place], [address], [place] [phone]   [facility] [address], [place] [phone]     Lucianne Lei [address], [place]   [phone] Only accepts [place] Access [place] patients after they have their name applied to their card.   Self-Pay (no insurance) in [facility]:  Organization         Address  Phone   Notes  Sickle Cell Patients, [facility] [place] [phone]   [facility] [place] [phone]   [facility] [place]  [place], [place], [place] [phone]   [facility]/Dr. Osei-Bonsu  [address], [place] or [address], [place] [phone] Phone number for both [place] and [place] locations is the same.  Urgent Medical and [facility] [address], [place] [phone]   [facility] [address] [place] or [address] [phone]   [facility] [address] [place] [phone], phone; [phone], fax Sees patients 1st and 3rd Saturday of every month.  Must not qualify for public or private insurance (i.e. Medicaid, Medicare, [place] Health Choice, Veterans' Benefits)  Household income should be no more than 200% of the poverty level The clinic cannot treat you if you are pregnant or think you are pregnant  Sexually transmitted diseases are not treated at the clinic.    Dental Care: Organization         Address  Phone  Notes  [facility] Department of [facility] [phone] Accepts children up to age 59 who are enrolled in [place] or [place]; pregnant women with a Medicaid card; and children who have applied for Medicaid or [place] Health Choice, but were declined, whose parents can pay a reduced fee at time of service.  [facility] Department of [facility]  [address], [place] [phone] Accepts children  up to age 48 who are enrolled in [place] or [place]; pregnant women with a Medicaid card; and children who have applied for Medicaid or [place] Health Choice, but were declined, whose parents can pay a reduced fee at time of service.  [place] [facility] Access PRO[facility]  [place] [phone] Patients are seen by appointment only. Walk-ins are not accepted. [place] will see patients 33 years of age and older. Monday - Tuesday (8am-5pm) Most Wednesdays (8:30-5pm) $30 per visit, cash only  [place]  Access PROGRAM  [address], [facility] [phone] Patients are seen by appointment only. Walk-ins are not accepted. [place] will see patients 61 years of age and older. One Wednesday Evening (Monthly: Volunteer Based).  $30 per visit, cash only  [place]  [phone] for adults; Children under age 15, call Graduate Pediatric Dentistry at [phone]. Children aged 45-14, please call [phone] to request a pediatric application.  Dental services are provided in all areas of dental care including fillings, crowns and bridges, complete and partial dentures, implants, gum treatment, root canals, and extractions. Preventive care is also provided. Treatment is provided to both adults and children. Patients are selected via a lottery and there is often a waiting list.   [facility] [address], [place]  [phone] [email or web address]   [facility] [address] [place], [place] [phone], Ext. [phone] Second and Fourth Thursday of each month, opens at 6:30 AM; Clinic ends at 9 AM.  Patients are seen on a first-come first-served basis, and a limited number are seen during each clinic.   [facility]  [address] [place], [place] [phone]   Eligibility Requirements You must have lived in [place], [place], or [place] counties for at least the last  three months.   You cannot be eligible for state or federal sponsored [employer], including [employer], [place], or [employer].   You generally cannot be eligible for healthcare insurance through your employer.    How to apply: Eligibility screenings are held every Tuesday and Wednesday afternoon from 1:00 pm until 4:00 pm. You do not need an appointment for the interview!  [facility] [address], [place], [place]   [place]  [place]  [place]  [phone]

## 2015-02-06 NOTE — ED Notes (Signed)
Gum pain x several months.  Pt has no teeth.  Bottom right rear pain.

## 2015-02-16 ENCOUNTER — Other Ambulatory Visit: Payer: Self-pay | Admitting: Physician Assistant

## 2015-03-15 ENCOUNTER — Other Ambulatory Visit: Payer: Self-pay | Admitting: Physician Assistant

## 2015-03-16 ENCOUNTER — Other Ambulatory Visit: Payer: Self-pay | Admitting: Physician Assistant

## 2015-03-16 NOTE — Telephone Encounter (Signed)
Rx sent to the pharmacy by e-script.  Pt need to schedule an office visit.//AB/CMA

## 2015-04-01 ENCOUNTER — Other Ambulatory Visit: Payer: Self-pay | Admitting: Physician Assistant

## 2015-04-02 DIAGNOSIS — K449 Diaphragmatic hernia without obstruction or gangrene: Secondary | ICD-10-CM | POA: Diagnosis not present

## 2015-04-02 DIAGNOSIS — Z85038 Personal history of other malignant neoplasm of large intestine: Secondary | ICD-10-CM | POA: Diagnosis not present

## 2015-04-02 DIAGNOSIS — D519 Vitamin B12 deficiency anemia, unspecified: Secondary | ICD-10-CM | POA: Diagnosis not present

## 2015-04-02 DIAGNOSIS — R600 Localized edema: Secondary | ICD-10-CM | POA: Diagnosis not present

## 2015-04-02 DIAGNOSIS — K8020 Calculus of gallbladder without cholecystitis without obstruction: Secondary | ICD-10-CM | POA: Diagnosis not present

## 2015-04-02 DIAGNOSIS — I7300 Raynaud's syndrome without gangrene: Secondary | ICD-10-CM | POA: Diagnosis not present

## 2015-04-02 DIAGNOSIS — K5900 Constipation, unspecified: Secondary | ICD-10-CM | POA: Diagnosis not present

## 2015-04-02 DIAGNOSIS — N281 Cyst of kidney, acquired: Secondary | ICD-10-CM | POA: Diagnosis not present

## 2015-04-02 DIAGNOSIS — J189 Pneumonia, unspecified organism: Secondary | ICD-10-CM | POA: Diagnosis not present

## 2015-04-02 DIAGNOSIS — I081 Rheumatic disorders of both mitral and tricuspid valves: Secondary | ICD-10-CM | POA: Diagnosis not present

## 2015-04-02 DIAGNOSIS — M7122 Synovial cyst of popliteal space [Baker], left knee: Secondary | ICD-10-CM | POA: Diagnosis not present

## 2015-04-02 DIAGNOSIS — K3189 Other diseases of stomach and duodenum: Secondary | ICD-10-CM | POA: Diagnosis not present

## 2015-04-02 DIAGNOSIS — Z23 Encounter for immunization: Secondary | ICD-10-CM | POA: Diagnosis not present

## 2015-04-02 DIAGNOSIS — R0602 Shortness of breath: Secondary | ICD-10-CM | POA: Diagnosis not present

## 2015-04-02 DIAGNOSIS — S80812A Abrasion, left lower leg, initial encounter: Secondary | ICD-10-CM | POA: Diagnosis not present

## 2015-04-02 DIAGNOSIS — K900 Celiac disease: Secondary | ICD-10-CM | POA: Diagnosis not present

## 2015-04-02 DIAGNOSIS — D509 Iron deficiency anemia, unspecified: Secondary | ICD-10-CM | POA: Diagnosis not present

## 2015-04-02 DIAGNOSIS — J9601 Acute respiratory failure with hypoxia: Secondary | ICD-10-CM | POA: Diagnosis not present

## 2015-04-02 DIAGNOSIS — J80 Acute respiratory distress syndrome: Secondary | ICD-10-CM | POA: Diagnosis not present

## 2015-04-02 DIAGNOSIS — I110 Hypertensive heart disease with heart failure: Secondary | ICD-10-CM | POA: Diagnosis not present

## 2015-04-02 DIAGNOSIS — L03116 Cellulitis of left lower limb: Secondary | ICD-10-CM | POA: Diagnosis not present

## 2015-04-02 DIAGNOSIS — I10 Essential (primary) hypertension: Secondary | ICD-10-CM | POA: Diagnosis not present

## 2015-04-02 DIAGNOSIS — K2950 Unspecified chronic gastritis without bleeding: Secondary | ICD-10-CM | POA: Diagnosis not present

## 2015-04-02 DIAGNOSIS — M7121 Synovial cyst of popliteal space [Baker], right knee: Secondary | ICD-10-CM | POA: Diagnosis not present

## 2015-04-02 DIAGNOSIS — T148 Other injury of unspecified body region: Secondary | ICD-10-CM | POA: Diagnosis not present

## 2015-04-02 DIAGNOSIS — I509 Heart failure, unspecified: Secondary | ICD-10-CM | POA: Diagnosis not present

## 2015-04-02 DIAGNOSIS — Z79899 Other long term (current) drug therapy: Secondary | ICD-10-CM | POA: Diagnosis not present

## 2015-04-02 DIAGNOSIS — R0682 Tachypnea, not elsewhere classified: Secondary | ICD-10-CM | POA: Diagnosis not present

## 2015-04-02 DIAGNOSIS — I5031 Acute diastolic (congestive) heart failure: Secondary | ICD-10-CM | POA: Diagnosis not present

## 2015-04-02 DIAGNOSIS — R935 Abnormal findings on diagnostic imaging of other abdominal regions, including retroperitoneum: Secondary | ICD-10-CM | POA: Diagnosis not present

## 2015-04-02 DIAGNOSIS — D649 Anemia, unspecified: Secondary | ICD-10-CM | POA: Diagnosis not present

## 2015-04-02 DIAGNOSIS — R946 Abnormal results of thyroid function studies: Secondary | ICD-10-CM | POA: Diagnosis not present

## 2015-04-02 DIAGNOSIS — K2960 Other gastritis without bleeding: Secondary | ICD-10-CM | POA: Diagnosis not present

## 2015-04-10 DIAGNOSIS — I110 Hypertensive heart disease with heart failure: Secondary | ICD-10-CM | POA: Diagnosis not present

## 2015-04-10 DIAGNOSIS — I7300 Raynaud's syndrome without gangrene: Secondary | ICD-10-CM | POA: Diagnosis not present

## 2015-04-10 DIAGNOSIS — I509 Heart failure, unspecified: Secondary | ICD-10-CM | POA: Diagnosis not present

## 2015-04-10 DIAGNOSIS — L03116 Cellulitis of left lower limb: Secondary | ICD-10-CM | POA: Diagnosis not present

## 2015-04-10 DIAGNOSIS — D649 Anemia, unspecified: Secondary | ICD-10-CM | POA: Diagnosis not present

## 2015-04-10 DIAGNOSIS — W548XXD Other contact with dog, subsequent encounter: Secondary | ICD-10-CM | POA: Diagnosis not present

## 2015-04-12 DIAGNOSIS — W548XXD Other contact with dog, subsequent encounter: Secondary | ICD-10-CM | POA: Diagnosis not present

## 2015-04-12 DIAGNOSIS — I5021 Acute systolic (congestive) heart failure: Secondary | ICD-10-CM | POA: Diagnosis not present

## 2015-04-12 DIAGNOSIS — I7300 Raynaud's syndrome without gangrene: Secondary | ICD-10-CM | POA: Diagnosis not present

## 2015-04-12 DIAGNOSIS — I509 Heart failure, unspecified: Secondary | ICD-10-CM | POA: Diagnosis not present

## 2015-04-12 DIAGNOSIS — L03116 Cellulitis of left lower limb: Secondary | ICD-10-CM | POA: Diagnosis not present

## 2015-04-12 DIAGNOSIS — D649 Anemia, unspecified: Secondary | ICD-10-CM | POA: Diagnosis not present

## 2015-04-12 DIAGNOSIS — I110 Hypertensive heart disease with heart failure: Secondary | ICD-10-CM | POA: Diagnosis not present

## 2015-04-13 DIAGNOSIS — L03116 Cellulitis of left lower limb: Secondary | ICD-10-CM | POA: Diagnosis not present

## 2015-04-13 DIAGNOSIS — D649 Anemia, unspecified: Secondary | ICD-10-CM | POA: Diagnosis not present

## 2015-04-13 DIAGNOSIS — I7300 Raynaud's syndrome without gangrene: Secondary | ICD-10-CM | POA: Diagnosis not present

## 2015-04-13 DIAGNOSIS — I509 Heart failure, unspecified: Secondary | ICD-10-CM | POA: Diagnosis not present

## 2015-04-13 DIAGNOSIS — I110 Hypertensive heart disease with heart failure: Secondary | ICD-10-CM | POA: Diagnosis not present

## 2015-04-13 DIAGNOSIS — W548XXD Other contact with dog, subsequent encounter: Secondary | ICD-10-CM | POA: Diagnosis not present

## 2015-04-14 DIAGNOSIS — I110 Hypertensive heart disease with heart failure: Secondary | ICD-10-CM | POA: Diagnosis not present

## 2015-04-14 DIAGNOSIS — I7300 Raynaud's syndrome without gangrene: Secondary | ICD-10-CM | POA: Diagnosis not present

## 2015-04-14 DIAGNOSIS — W548XXD Other contact with dog, subsequent encounter: Secondary | ICD-10-CM | POA: Diagnosis not present

## 2015-04-14 DIAGNOSIS — L03116 Cellulitis of left lower limb: Secondary | ICD-10-CM | POA: Diagnosis not present

## 2015-04-14 DIAGNOSIS — D649 Anemia, unspecified: Secondary | ICD-10-CM | POA: Diagnosis not present

## 2015-04-14 DIAGNOSIS — I509 Heart failure, unspecified: Secondary | ICD-10-CM | POA: Diagnosis not present

## 2015-04-15 ENCOUNTER — Telehealth: Payer: Self-pay

## 2015-04-15 DIAGNOSIS — I7300 Raynaud's syndrome without gangrene: Secondary | ICD-10-CM | POA: Diagnosis not present

## 2015-04-15 DIAGNOSIS — L03116 Cellulitis of left lower limb: Secondary | ICD-10-CM | POA: Diagnosis not present

## 2015-04-15 DIAGNOSIS — W548XXD Other contact with dog, subsequent encounter: Secondary | ICD-10-CM | POA: Diagnosis not present

## 2015-04-15 DIAGNOSIS — I509 Heart failure, unspecified: Secondary | ICD-10-CM | POA: Diagnosis not present

## 2015-04-15 DIAGNOSIS — D649 Anemia, unspecified: Secondary | ICD-10-CM | POA: Diagnosis not present

## 2015-04-15 DIAGNOSIS — I110 Hypertensive heart disease with heart failure: Secondary | ICD-10-CM | POA: Diagnosis not present

## 2015-04-15 NOTE — Telephone Encounter (Signed)
Mrs Amber [name] wrote down [employer] number to give her daughter so she can call us back. Got refill request for Metoprolol but last OV in Epic was 2014 with PRN follow up. Last refill was only 3 month supply.

## 2015-04-16 NOTE — Telephone Encounter (Signed)
Spoke with pt dtr, aware the pt will need to get refills for the metoprolol for the primary doctor. The dtr reports the pt was recently in [facility] and wondered if she needed to see [place]. Advised to f/u with primary care as we do not have the records from recent hosp. dtr voiced understanding.

## 2015-04-16 NOTE — Telephone Encounter (Signed)
Follow up ° ° ° ° ° °Returning a call to the nurse °

## 2015-04-19 DIAGNOSIS — W548XXD Other contact with dog, subsequent encounter: Secondary | ICD-10-CM | POA: Diagnosis not present

## 2015-04-19 DIAGNOSIS — D649 Anemia, unspecified: Secondary | ICD-10-CM | POA: Diagnosis not present

## 2015-04-19 DIAGNOSIS — I509 Heart failure, unspecified: Secondary | ICD-10-CM | POA: Diagnosis not present

## 2015-04-19 DIAGNOSIS — L03116 Cellulitis of left lower limb: Secondary | ICD-10-CM | POA: Diagnosis not present

## 2015-04-19 DIAGNOSIS — I110 Hypertensive heart disease with heart failure: Secondary | ICD-10-CM | POA: Diagnosis not present

## 2015-04-19 DIAGNOSIS — I7300 Raynaud's syndrome without gangrene: Secondary | ICD-10-CM | POA: Diagnosis not present

## 2015-04-20 DIAGNOSIS — I509 Heart failure, unspecified: Secondary | ICD-10-CM | POA: Diagnosis not present

## 2015-04-20 DIAGNOSIS — I110 Hypertensive heart disease with heart failure: Secondary | ICD-10-CM | POA: Diagnosis not present

## 2015-04-20 DIAGNOSIS — I7300 Raynaud's syndrome without gangrene: Secondary | ICD-10-CM | POA: Diagnosis not present

## 2015-04-20 DIAGNOSIS — L03116 Cellulitis of left lower limb: Secondary | ICD-10-CM | POA: Diagnosis not present

## 2015-04-20 DIAGNOSIS — D649 Anemia, unspecified: Secondary | ICD-10-CM | POA: Diagnosis not present

## 2015-04-20 DIAGNOSIS — W548XXD Other contact with dog, subsequent encounter: Secondary | ICD-10-CM | POA: Diagnosis not present

## 2015-04-21 DIAGNOSIS — I509 Heart failure, unspecified: Secondary | ICD-10-CM | POA: Diagnosis not present

## 2015-04-21 DIAGNOSIS — L03116 Cellulitis of left lower limb: Secondary | ICD-10-CM | POA: Diagnosis not present

## 2015-04-21 DIAGNOSIS — I7300 Raynaud's syndrome without gangrene: Secondary | ICD-10-CM | POA: Diagnosis not present

## 2015-04-21 DIAGNOSIS — I110 Hypertensive heart disease with heart failure: Secondary | ICD-10-CM | POA: Diagnosis not present

## 2015-04-21 DIAGNOSIS — D649 Anemia, unspecified: Secondary | ICD-10-CM | POA: Diagnosis not present

## 2015-04-21 DIAGNOSIS — W548XXD Other contact with dog, subsequent encounter: Secondary | ICD-10-CM | POA: Diagnosis not present

## 2015-04-24 ENCOUNTER — Other Ambulatory Visit: Payer: Self-pay | Admitting: Physician Assistant

## 2015-04-26 DIAGNOSIS — I5031 Acute diastolic (congestive) heart failure: Secondary | ICD-10-CM | POA: Diagnosis not present

## 2015-04-26 DIAGNOSIS — D649 Anemia, unspecified: Secondary | ICD-10-CM | POA: Diagnosis not present

## 2015-04-26 DIAGNOSIS — J189 Pneumonia, unspecified organism: Secondary | ICD-10-CM | POA: Diagnosis not present

## 2015-04-26 DIAGNOSIS — I10 Essential (primary) hypertension: Secondary | ICD-10-CM | POA: Diagnosis not present

## 2015-04-26 DIAGNOSIS — E538 Deficiency of other specified B group vitamins: Secondary | ICD-10-CM | POA: Diagnosis not present

## 2015-04-26 NOTE — Telephone Encounter (Signed)
Rx sent to the pharmacy by e-script.  Pt needs to schedule an appt for CPE and fasting labs.//AB/CMA

## 2015-05-01 DIAGNOSIS — D649 Anemia, unspecified: Secondary | ICD-10-CM | POA: Diagnosis not present

## 2015-05-01 DIAGNOSIS — E538 Deficiency of other specified B group vitamins: Secondary | ICD-10-CM | POA: Diagnosis not present

## 2015-05-11 DIAGNOSIS — E538 Deficiency of other specified B group vitamins: Secondary | ICD-10-CM | POA: Diagnosis not present

## 2015-05-11 DIAGNOSIS — D649 Anemia, unspecified: Secondary | ICD-10-CM | POA: Diagnosis not present

## 2015-05-11 DIAGNOSIS — L299 Pruritus, unspecified: Secondary | ICD-10-CM | POA: Diagnosis not present

## 2015-05-11 DIAGNOSIS — R600 Localized edema: Secondary | ICD-10-CM | POA: Diagnosis not present

## 2015-05-26 DIAGNOSIS — M25559 Pain in unspecified hip: Secondary | ICD-10-CM | POA: Diagnosis not present

## 2015-05-26 DIAGNOSIS — R102 Pelvic and perineal pain: Secondary | ICD-10-CM | POA: Diagnosis not present

## 2015-05-26 DIAGNOSIS — T148 Other injury of unspecified body region: Secondary | ICD-10-CM | POA: Diagnosis not present

## 2015-05-26 DIAGNOSIS — S3993XA Unspecified injury of pelvis, initial encounter: Secondary | ICD-10-CM | POA: Diagnosis not present

## 2015-05-26 DIAGNOSIS — E538 Deficiency of other specified B group vitamins: Secondary | ICD-10-CM | POA: Diagnosis not present

## 2015-07-13 DIAGNOSIS — D649 Anemia, unspecified: Secondary | ICD-10-CM | POA: Diagnosis not present

## 2015-07-14 DIAGNOSIS — D649 Anemia, unspecified: Secondary | ICD-10-CM | POA: Diagnosis not present

## 2015-07-14 DIAGNOSIS — M7122 Synovial cyst of popliteal space [Baker], left knee: Secondary | ICD-10-CM | POA: Diagnosis not present

## 2015-07-14 DIAGNOSIS — R600 Localized edema: Secondary | ICD-10-CM | POA: Diagnosis not present

## 2017-03-17 IMAGING — US US EXTREM LOW VENOUS*L*
1 series · 13 of 24 positions shown · non-contrast
Comparison: None.

CLINICAL DATA: 89-year-old female with 6 month history of left
lower extremity pain and swelling which has recently increased.



[Series 1: us extrem low venous*left* · 0.07mm/px · 13 of 33 slices shown]
[im 1/33]
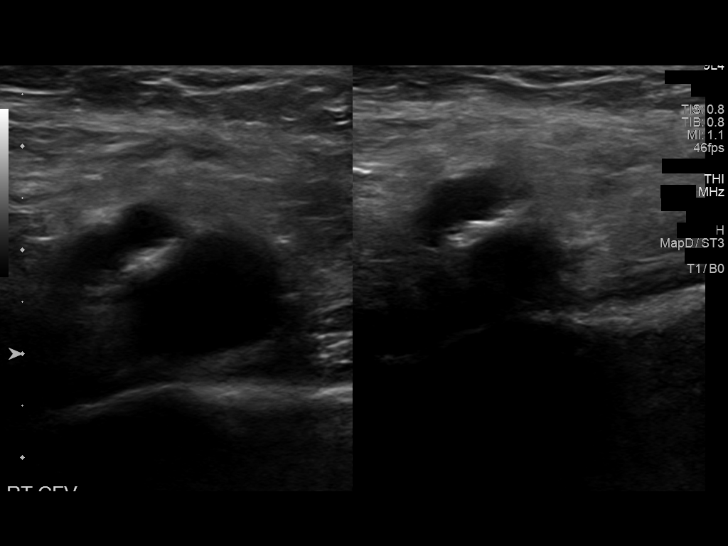
[im 3/33]
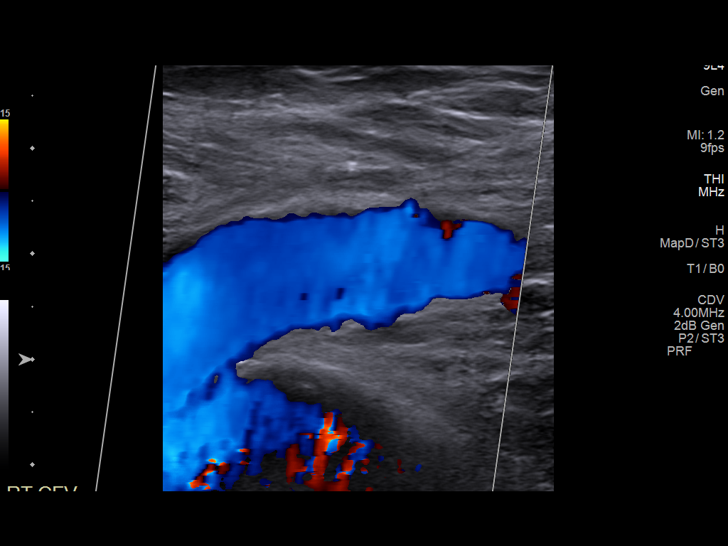
[im 6/33]
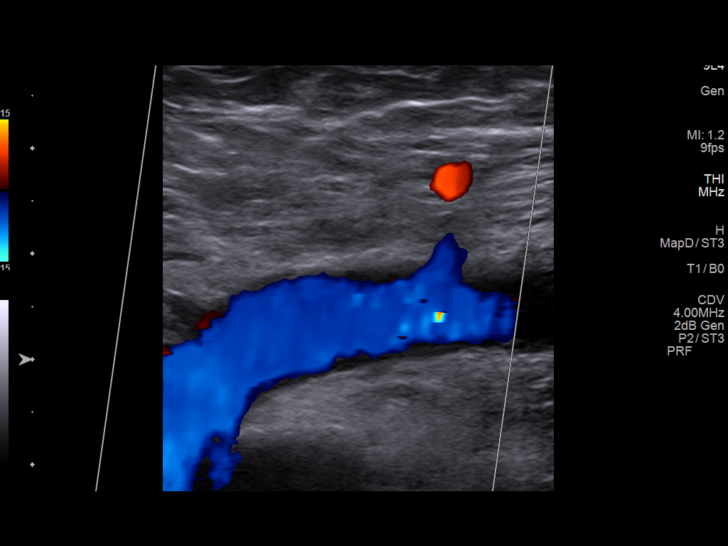
[im 9/33]
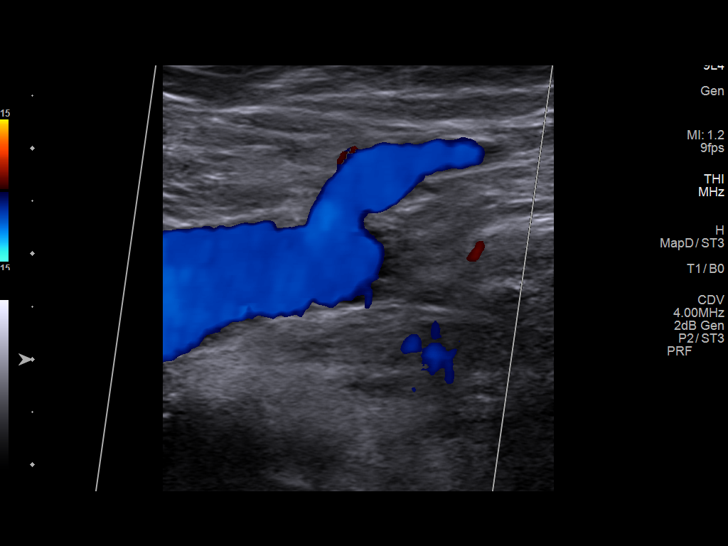
[im 12/33]
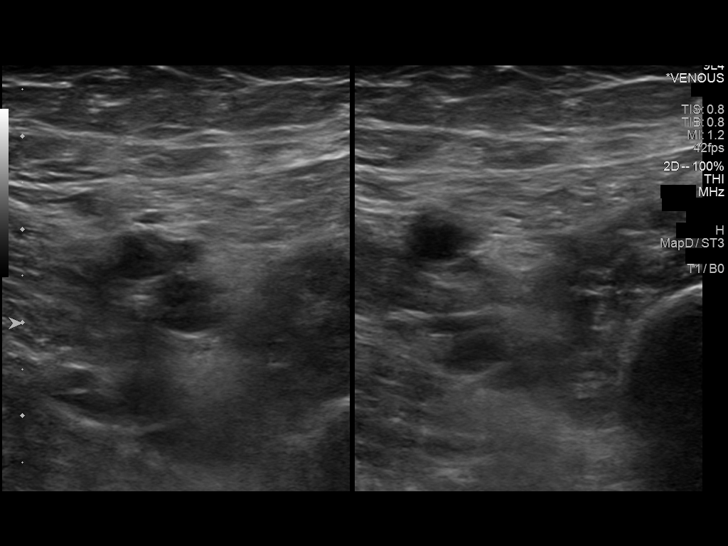
[im 14/33]
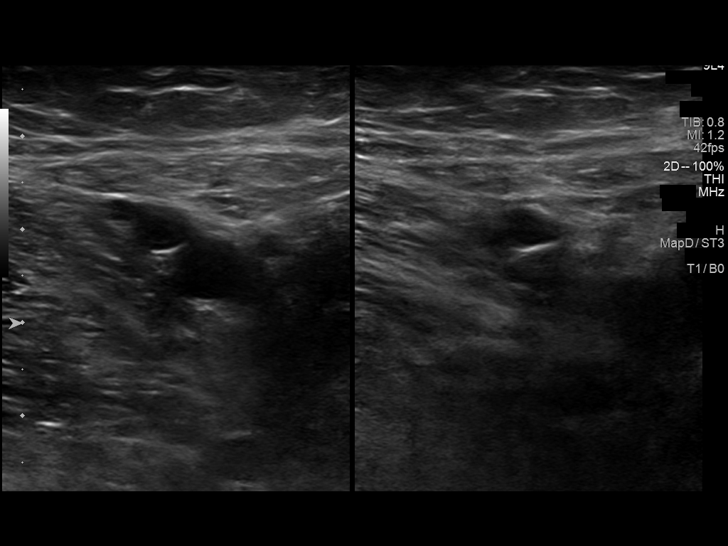
[im 17/33]
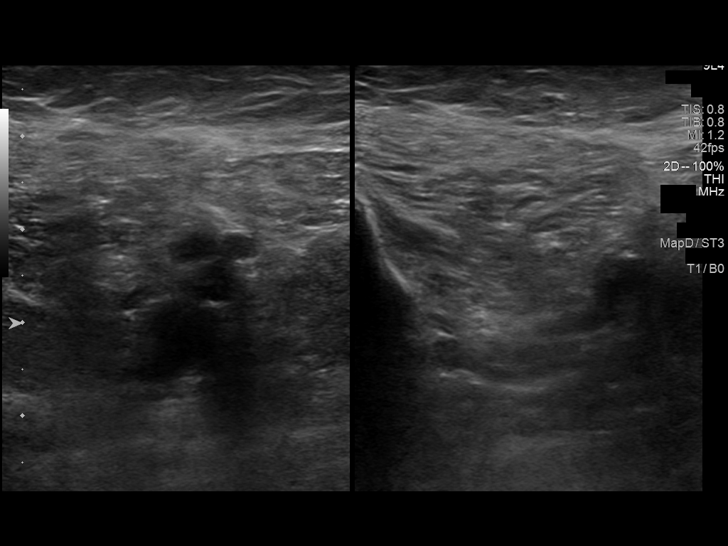
[im 19/33]
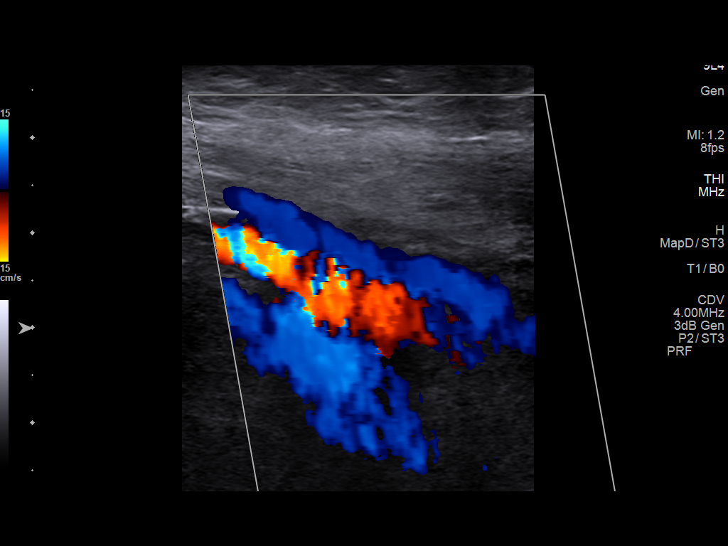
[im 21/33]
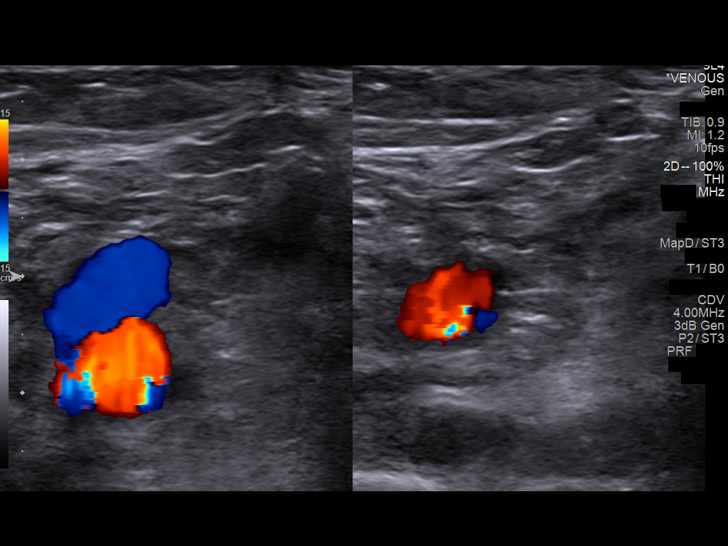
[im 24/33]
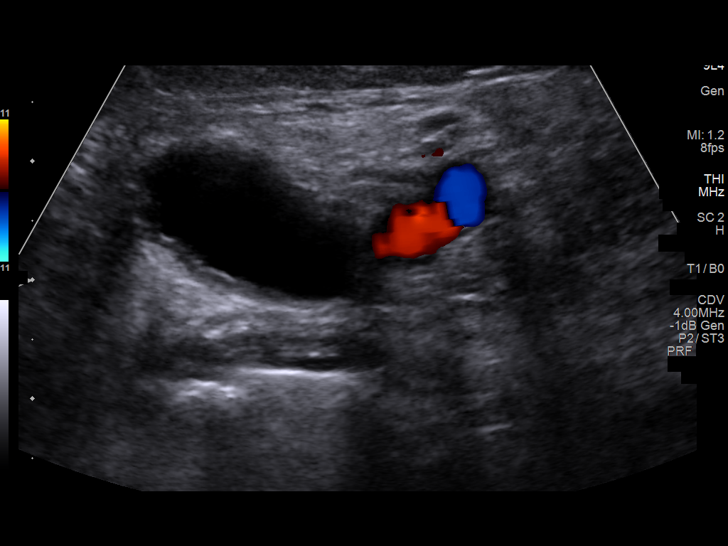
[im 27/33]
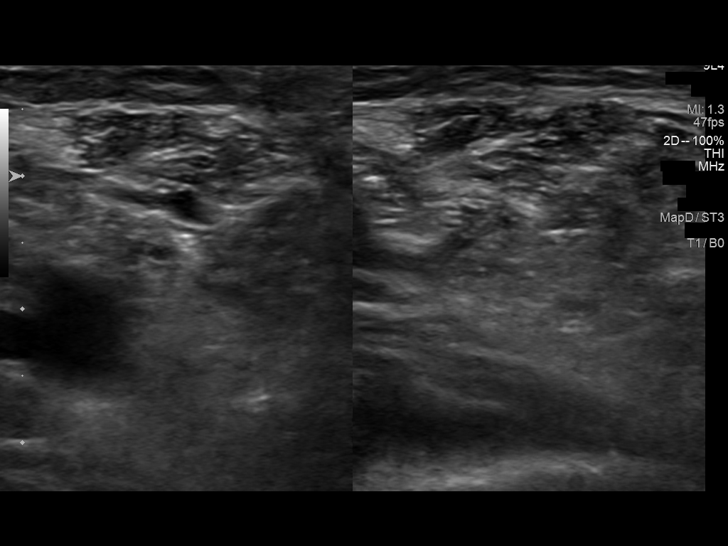
[im 30/33]
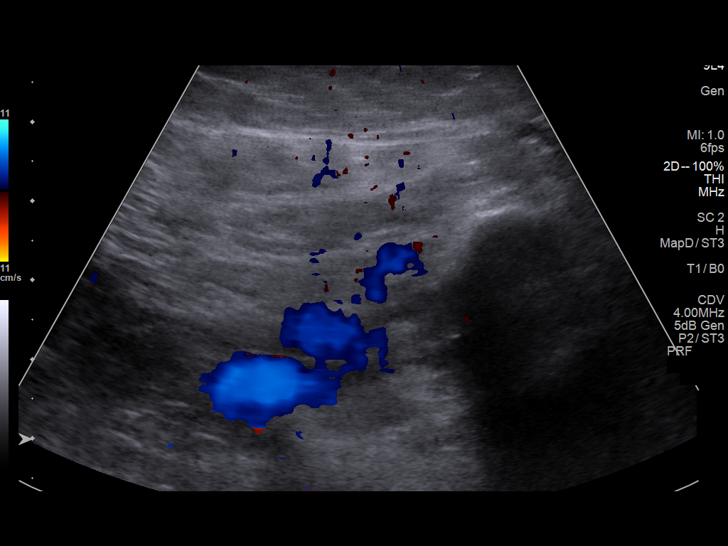
[im 33/33]
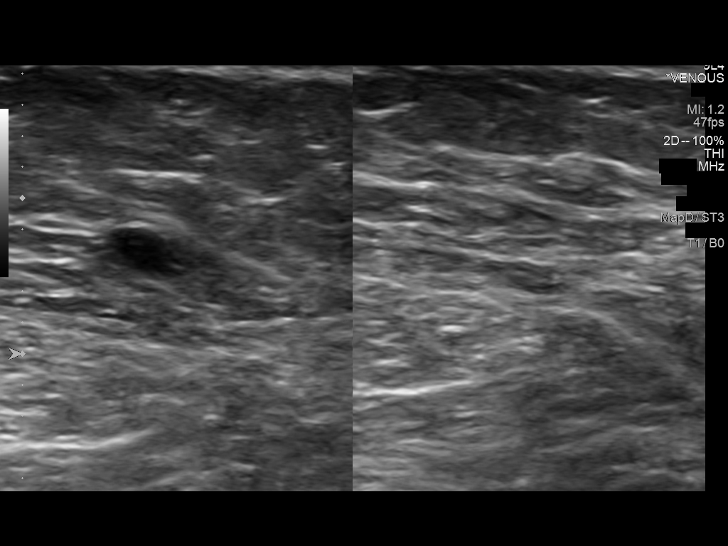

[13 of 24 positions shown; findings below may reference images not displayed]

FINDINGS: Contralateral Common Femoral Vein: Respiratory phasicity is normal
and symmetric with the symptomatic side. No evidence of thrombus.
Normal compressibility.

Common Femoral Vein: No evidence of thrombus. Normal
compressibility, respiratory phasicity and response to augmentation.

Saphenofemoral Junction: No evidence of thrombus. Normal
compressibility and flow on color Doppler imaging.

Profunda Femoral Vein: No evidence of thrombus. Normal
compressibility and flow on color Doppler imaging.

Femoral Vein: No evidence of thrombus. Normal compressibility,
respiratory phasicity and response to augmentation.

Popliteal Vein: No evidence of thrombus. Normal compressibility,
respiratory phasicity and response to augmentation.

Calf Veins: No evidence of thrombus. Normal compressibility and flow
on color Doppler imaging.

Superficial Great Saphenous Vein: No evidence of thrombus. Normal
compressibility and flow on color Doppler imaging.

Venous Reflux:  None.

Other Findings: Fluid collection in the popliteal fossa measures
x 1.3 x 1.8 cm and is consistent with a Baker's cyst.
IMPRESSION: 1. Negative for evidence of deep venous thrombosis.
2. Baker's cyst in the popliteal fossa.

## 2017-06-27 ENCOUNTER — Encounter: Payer: Self-pay | Admitting: Emergency Medicine

## 2017-08-06 DEATH — deceased

## 2017-09-06 DEATH — deceased
